# Patient Record
Sex: Male | Born: 1970
Health system: Southern US, Community
[De-identification: ages and names within clinical notes are randomized; demographics above are authoritative.]

## PROBLEM LIST (undated history)

## (undated) DIAGNOSIS — J302 Other seasonal allergic rhinitis: Secondary | ICD-10-CM

## (undated) DIAGNOSIS — J449 Chronic obstructive pulmonary disease, unspecified: Secondary | ICD-10-CM

## (undated) DIAGNOSIS — I10 Essential (primary) hypertension: Secondary | ICD-10-CM

## (undated) HISTORY — PX: HERNIA REPAIR: SHX51

---

## 2002-03-25 ENCOUNTER — Emergency Department (HOSPITAL_COMMUNITY): Admission: EM | Admit: 2002-03-25 | Discharge: 2002-03-25 | Payer: Self-pay | Admitting: Emergency Medicine

## 2002-03-25 ENCOUNTER — Encounter: Payer: Self-pay | Admitting: Emergency Medicine

## 2004-01-25 ENCOUNTER — Emergency Department (HOSPITAL_COMMUNITY): Admission: EM | Admit: 2004-01-25 | Discharge: 2004-01-26 | Payer: Self-pay | Admitting: Emergency Medicine

## 2008-12-11 ENCOUNTER — Emergency Department (HOSPITAL_COMMUNITY): Admission: EM | Admit: 2008-12-11 | Discharge: 2008-12-12 | Payer: Self-pay | Admitting: Emergency Medicine

## 2012-02-24 ENCOUNTER — Emergency Department (HOSPITAL_COMMUNITY)
Admission: EM | Admit: 2012-02-24 | Discharge: 2012-02-24 | Disposition: A | Discharge status: Home / Self Care | Payer: Self-pay | Attending: Emergency Medicine | Admitting: Emergency Medicine

## 2012-02-24 ENCOUNTER — Encounter (HOSPITAL_COMMUNITY): Payer: Self-pay | Admitting: *Deleted

## 2012-02-24 DIAGNOSIS — F17200 Nicotine dependence, unspecified, uncomplicated: Secondary | ICD-10-CM | POA: Insufficient documentation

## 2012-02-24 DIAGNOSIS — T394X5A Adverse effect of antirheumatics, not elsewhere classified, initial encounter: Secondary | ICD-10-CM | POA: Insufficient documentation

## 2012-02-24 DIAGNOSIS — T783XXA Angioneurotic edema, initial encounter: Secondary | ICD-10-CM | POA: Insufficient documentation

## 2012-02-24 LAB — POCT I-STAT, CHEM 8
BUN: 11 mg/dL (ref 6–23)
Calcium, Ion: 1.14 mmol/L (ref 1.12–1.23)
Chloride: 108 mEq/L (ref 96–112)
Creatinine, Ser: 0.8 mg/dL (ref 0.50–1.35)
Glucose, Bld: 97 mg/dL (ref 70–99)
HCT: 40 % (ref 39.0–52.0)
Hemoglobin: 13.6 g/dL (ref 13.0–17.0)
Potassium: 5 mEq/L (ref 3.5–5.1)
Sodium: 139 mEq/L (ref 135–145)
TCO2: 24 mmol/L (ref 0–100)

## 2012-02-24 MED ORDER — DIPHENHYDRAMINE HCL 50 MG/ML IJ SOLN
25.0000 mg | Freq: Once | INTRAMUSCULAR | Status: AC
Start: 2012-02-24 — End: 2012-02-24
  Administered 2012-02-24: 25 mg via INTRAVENOUS
  Filled 2012-02-24: qty 1

## 2012-02-24 MED ORDER — FAMOTIDINE 40 MG PO TABS: 40.0000 mg | ORAL_TABLET | Freq: Two times a day (BID) | ORAL | Status: DC

## 2012-02-24 MED ORDER — DIPHENHYDRAMINE HCL 25 MG PO TABS: 25.0000 mg | ORAL_TABLET | Freq: Four times a day (QID) | ORAL | Status: DC

## 2012-02-24 MED ORDER — FAMOTIDINE IN NACL 20-0.9 MG/50ML-% IV SOLN
20.0000 mg | Freq: Once | INTRAVENOUS | Status: AC
  Administered 2012-02-24: 20 mg via INTRAVENOUS
  Filled 2012-02-24: qty 50

## 2012-02-24 MED ORDER — SODIUM CHLORIDE 0.9 % IV SOLN
INTRAVENOUS | Status: DC
  Administered 2012-02-24: 125 mL/h via INTRAVENOUS

## 2012-02-24 MED ORDER — METHYLPREDNISOLONE SODIUM SUCC 125 MG IJ SOLR
125.0000 mg | Freq: Once | INTRAMUSCULAR | Status: AC
  Administered 2012-02-24: 125 mg via INTRAVENOUS
  Filled 2012-02-24: qty 2

## 2012-02-24 MED ORDER — PREDNISONE 20 MG PO TABS: 60.0000 mg | ORAL_TABLET | Freq: Every day | ORAL | Status: DC

## 2012-02-24 MED ORDER — EPINEPHRINE 0.3 MG/0.3ML IJ DEVI: 0.3000 mg | INTRAMUSCULAR | Status: DC | PRN

## 2012-02-24 NOTE — ED Notes (Signed)
Pt states he woke up this am with swelling to right face and lips which has progressively worsened. pts airway intact. Tongue not swollen. Pt denies taking medications or known allergy.

## 2012-02-24 NOTE — ED Provider Notes (Signed)
History     CSN: [id number] Arrival date & time 02/24/12  1450 First MD Initiated Contact with Patient 02/24/12 1616      Chief Complaint  Patient presents with  . Facial Swelling  . Oral Swelling   HPI Pt had taken ibuprofen on Saturday for back pain but he has taken that before without problems.  No other new medications.  No BP medications.   No new foods that he is aware of.  No known insect bites.  Pt noticed swelling in his lips this am.  He denies trouble with breathing or swallowing.  He did notice a little rash yesterday but it resolved.  He has never had this before. History reviewed. No pertinent past medical history.  History reviewed. No pertinent past surgical history.  History reviewed. No pertinent family history.  History  Substance Use Topics  . Smoking status: Current Every Day Smoker    Types: Cigarettes  . Smokeless tobacco: Not on file  . Alcohol Use: Yes      Review of Systems  All other systems reviewed and are negative.    Allergies  Dilaudid  Home Medications   Current Outpatient Rx  Name Route Sig Dispense Refill  . CETIRIZINE HCL 1 MG/ML PO SYRP Oral Take 10 mg by mouth daily.    . IBUPROFEN 200 MG PO TABS Oral Take 400 mg by mouth every 6 (six) hours as needed. Pain      BP 125/68  Pulse 73  Temp 99.1 F (37.3 C) (Oral)  Resp 18  Ht 5\' 10"  (1.778 m)  Wt 175 lb (79.379 kg)  BMI 25.11 kg/m2  SpO2 99%  Physical Exam  Nursing note and vitals reviewed. Constitutional: He appears well-developed and well-nourished. No distress.  HENT:  Head: Normocephalic and atraumatic. No trismus in the jaw.  Right Ear: External ear normal.  Left Ear: External ear normal.  Mouth/Throat: Mucous membranes are not dry and not cyanotic. Normal dentition. No dental caries. No oropharyngeal exudate or posterior oropharyngeal edema.       The patient has edema in his upper and lower lip with some perioral swelling  Eyes: Conjunctivae normal are  normal. Right eye exhibits no discharge. Left eye exhibits no discharge. No scleral icterus.  Neck: Neck supple. No tracheal deviation present.  Cardiovascular: Normal rate, regular rhythm and intact distal pulses.   Pulmonary/Chest: Effort normal and breath sounds normal. No stridor. No respiratory distress. He has no wheezes. He has no rales.  Abdominal: Soft. Bowel sounds are normal. He exhibits no distension. There is no tenderness. There is no rebound and no guarding.  Musculoskeletal: He exhibits no edema and no tenderness.  Neurological: He is alert. He has normal strength. No sensory deficit. Cranial nerve deficit:  no gross defecits noted. He exhibits normal muscle tone. He displays no seizure activity. Coordination normal.  Skin: Skin is warm and dry. No rash noted.  Psychiatric: He has a normal mood and affect.    ED Course  Procedures (including critical care time)   Labs Reviewed  POCT I-STAT, CHEM 8   No results found.   1. Angioedema       MDM  The patient was treated with Benadryl, Pepcid and Solu-Medrol.  He was monitored in the emergency department.  The patient did not have any difficulty with swallowing or breathing. We'll discharge him home with prescriptions for Benadryl Pepcid and prednisone. He also be given a prescription for an EpiPen to carry with him  as a precaution. I will give him referral to an allergist.  At this time etiology of his angioedema is unclear. He's not having any pain to suggest some type of localized reaction. He is not on any new medications        Celene Kras, MD 02/24/12 [phone]

## 2013-10-25 ENCOUNTER — Encounter (HOSPITAL_COMMUNITY): Payer: Self-pay | Admitting: Emergency Medicine

## 2013-10-25 ENCOUNTER — Emergency Department (HOSPITAL_COMMUNITY)
Admission: EM | Admit: 2013-10-25 | Discharge: 2013-10-25 | Disposition: A | Discharge status: Home / Self Care | Payer: Self-pay | Attending: Emergency Medicine | Admitting: Emergency Medicine

## 2013-10-25 DIAGNOSIS — Z791 Long term (current) use of non-steroidal anti-inflammatories (NSAID): Secondary | ICD-10-CM | POA: Insufficient documentation

## 2013-10-25 DIAGNOSIS — Z79899 Other long term (current) drug therapy: Secondary | ICD-10-CM | POA: Insufficient documentation

## 2013-10-25 DIAGNOSIS — IMO0002 Reserved for concepts with insufficient information to code with codable children: Secondary | ICD-10-CM | POA: Insufficient documentation

## 2013-10-25 DIAGNOSIS — K0889 Other specified disorders of teeth and supporting structures: Secondary | ICD-10-CM | POA: Diagnosis present

## 2013-10-25 DIAGNOSIS — K089 Disorder of teeth and supporting structures, unspecified: Secondary | ICD-10-CM | POA: Insufficient documentation

## 2013-10-25 DIAGNOSIS — K0381 Cracked tooth: Secondary | ICD-10-CM | POA: Insufficient documentation

## 2013-10-25 DIAGNOSIS — F17200 Nicotine dependence, unspecified, uncomplicated: Secondary | ICD-10-CM | POA: Insufficient documentation

## 2013-10-25 MED ORDER — PENICILLIN V POTASSIUM 500 MG PO TABS: 500.0000 mg | ORAL_TABLET | Freq: Four times a day (QID) | ORAL | Status: AC

## 2013-10-25 MED ORDER — OXYCODONE-ACETAMINOPHEN 5-325 MG PO TABS: 1.0000 | ORAL_TABLET | Freq: Four times a day (QID) | ORAL | Status: DC | PRN

## 2013-10-25 MED ORDER — OXYCODONE-ACETAMINOPHEN 5-325 MG PO TABS
1.0000 | ORAL_TABLET | Freq: Once | ORAL | Status: AC
  Administered 2013-10-25: 1 via ORAL
  Filled 2013-10-25: qty 1

## 2013-10-25 NOTE — ED Notes (Signed)
The pt is c/o a toothache for 2 days and his rt face is swollen

## 2013-10-25 NOTE — ED Provider Notes (Signed)
CSN: [id number]     Arrival date & time 10/25/13  0617 History   First MD Initiated Contact with Patient 10/25/13 [phone]     Chief Complaint  Patient presents with  . Dental Pain     (Consider location/radiation/quality/duration/timing/severity/associated sxs/prior Treatment) Patient is a 43 y.o. male presenting with tooth pain. The history is provided by the patient.  Dental Pain Location:  Upper Upper teeth location:  1/RU 3rd molar Quality:  Aching Severity:  Moderate Onset quality:  Gradual Duration:  2 days Timing:  Constant Progression:  Worsening Chronicity:  Recurrent Context comment:  Chronic fracture Relieved by:  Nothing Worsened by:  Nothing tried Ineffective treatments:  NSAIDs Associated symptoms: no drooling, no fever, no headaches and no neck pain     History reviewed. No pertinent past medical history. History reviewed. No pertinent past surgical history. No family history on file. History  Substance Use Topics  . Smoking status: Current Every Day Smoker    Types: Cigarettes  . Smokeless tobacco: Not on file  . Alcohol Use: Yes    Review of Systems  Constitutional: Negative for fever.  HENT: Negative for drooling and rhinorrhea.   Eyes: Negative for pain.  Respiratory: Negative for cough and shortness of breath.   Cardiovascular: Negative for chest pain and leg swelling.  Gastrointestinal: Negative for nausea, vomiting, abdominal pain and diarrhea.  Genitourinary: Negative for dysuria and hematuria.  Musculoskeletal: Negative for gait problem and neck pain.  Skin: Negative for color change.  Neurological: Negative for numbness and headaches.  Hematological: Negative for adenopathy.  Psychiatric/Behavioral: Negative for behavioral problems.  All other systems reviewed and are negative.     Allergies  Dilaudid  Home Medications   Prior to Admission medications   Medication Sig Start Date End Date Taking? Authorizing Provider  cetirizine  (ZYRTEC) 1 MG/ML syrup Take 10 mg by mouth daily.    Historical Provider, MD  diphenhydrAMINE (BENADRYL) 25 MG tablet Take 1 tablet (25 mg total) by mouth every 6 (six) hours. 02/24/12   Linwood DibblesJon Knapp, MD  EPINEPHrine (EPIPEN) 0.3 mg/0.3 mL DEVI Inject 0.3 mLs (0.3 mg total) into the muscle as needed (Use for life threatening allergic reaction if you begin to feel you're having difficulty with breathing or swallowing). 02/24/12   Linwood DibblesJon Knapp, MD  famotidine (PEPCID) 40 MG tablet Take 1 tablet (40 mg total) by mouth 2 (two) times daily. 02/24/12   Linwood DibblesJon Knapp, MD  ibuprofen (ADVIL,MOTRIN) 200 MG tablet Take 400 mg by mouth every 6 (six) hours as needed. Pain    Historical Provider, MD  predniSONE (DELTASONE) 20 MG tablet Take 3 tablets (60 mg total) by mouth daily. 02/24/12   Linwood DibblesJon Knapp, MD   BP 134/90  Pulse 98  Temp(Src) 99 F (37.2 C) (Oral)  Resp 20  Ht 5\' 11"  (1.803 m)  Wt 156 lb 8 oz (70.988 kg)  BMI 21.84 kg/m2  SpO2 97% Physical Exam  Nursing note and vitals reviewed. Constitutional: He is oriented to person, place, and time. He appears well-developed and well-nourished.  HENT:  Head: Normocephalic and atraumatic.  Right Ear: External ear normal.  Left Ear: External ear normal.  Nose: Nose normal.  Mouth/Throat: Oropharynx is clear and moist. No oropharyngeal exudate.  Chronic appearing Rennis Hardingllis type III fracture of right upper third molar.  No evidence of oral abscess.  No trismus noted.  Normal range of motion of the neck.  Mildly prominent bilateral anterior cervical adenopathy.  Mild swelling of the right  buccal mucosa.  Normal appearing tympanic membranes bilaterally.  Eyes: Conjunctivae and EOM are normal. Pupils are equal, round, and reactive to light.  Neck: Normal range of motion. Neck supple.  Cardiovascular: Normal rate, regular rhythm, normal heart sounds and intact distal pulses.  Exam reveals no gallop and no friction rub.   No murmur heard. Pulmonary/Chest: Effort  normal and breath sounds normal. No respiratory distress. He has no wheezes.  Abdominal: Soft. Bowel sounds are normal. He exhibits no distension. There is no tenderness. There is no rebound and no guarding.  Musculoskeletal: Normal range of motion. He exhibits no edema and no tenderness.  Neurological: He is alert and oriented to person, place, and time.  Skin: Skin is warm and dry.  Psychiatric: He has a normal mood and affect. His behavior is normal.    ED Course  Procedures (including critical care time) Labs Review Labs Reviewed - No data to display  Imaging Review No results found.   EKG Interpretation None      MDM   Final diagnoses:  Pain, dental    7:02 AM 43 y.o. male presents with dental pain. He states that he has had intermittent dental pain do to a fractured right upper third molar for the last 2 years. He notes the began again 2 days ago and has had some referred pain to his ear and head. He denies any fevers. He is afebrile and vital signs are unremarkable here. He has mild swelling to the right buccal mucosa but no evidence of periapical abscess. Will provide antibiotics and referral.  7:03 AM:  I have discussed the diagnosis/risks/treatment options with the patient and believe the pt to be eligible for discharge home to follow-up with dentist asap. We also discussed returning to the ED immediately if new or worsening sx occur. We discussed the sx which are most concerning (e.g., inc swelling, difficulty handling secretions, fever) that necessitate immediate return. Medications administered to the patient during their visit and any new prescriptions provided to the patient are listed below.  Medications given during this visit Medications  oxyCODONE-acetaminophen (PERCOCET/ROXICET) 5-325 MG per tablet 1 tablet (not administered)    New Prescriptions   OXYCODONE-ACETAMINOPHEN (PERCOCET) 5-325 MG PER TABLET    Take 1 tablet by mouth every 6 (six) hours as needed  for moderate pain.   PENICILLIN V POTASSIUM (VEETID) 500 MG TABLET    Take 1 tablet (500 mg total) by mouth 4 (four) times daily.       Junius ArgyleForrest S Harrison, MD 10/25/13 [phone]

## 2013-10-25 NOTE — Discharge Instructions (Signed)
Dental Pain °Toothache is pain in or around a tooth. It may get worse with chewing or with cold or heat.  °HOME CARE °· Your dentist may use a numbing medicine during treatment. If so, you may need to avoid eating until the medicine wears off. Ask your dentist about this. °· Only take medicine as told by your dentist or doctor. °· Avoid chewing food near the painful tooth until after all treatment is done. Ask your dentist about this. °GET HELP RIGHT AWAY IF:  °· The problem gets worse or new problems appear. °· You have a fever. °· There is redness and puffiness (swelling) of the face, jaw, or neck. °· You cannot open your mouth. °· There is pain in the jaw. °· There is very bad pain that is not helped by medicine. °MAKE SURE YOU:  °· Understand these instructions. °· Will watch your condition. °· Will get help right away if you are not doing well or get worse. °Document Released: 10/09/2007 Document Revised: 07/15/2011 Document Reviewed: 10/09/2007 °ExitCare® Patient Information ©2015 [employer]. This information is not intended to replace advice given to you by your health care provider. Make sure you discuss any questions you have with your health care provider. ° ° ° °Emergency Department Resource Guide °1) Find a Doctor and Pay Out of Pocket °Although you won't have to find out who is covered by your insurance plan, it is a good idea to ask around and get recommendations. You will then need to call the office and see if the doctor you have chosen will accept you as a new patient and what types of options they offer for patients who are self-pay. Some doctors offer discounts or will set up payment plans for their patients who do not have insurance, but you will need to ask so you aren't surprised when you get to your appointment. ° °2) Contact Your Local Health Department °Not all health departments have doctors that can see patients for sick visits, but many do, so it is worth a call to see if yours does.  If you don't know where your local health department is, you can check in your phone book. The CDC also has a tool to help you locate your state's health department, and many state websites also have listings of all of their local health departments. ° °3) Find a Walk-in Clinic °If your illness is not likely to be very severe or complicated, you may want to try a walk in clinic. These are popping up all over the country in pharmacies, drugstores, and shopping centers. They're usually staffed by nurse practitioners or physician assistants that have been trained to treat common illnesses and complaints. They're usually fairly quick and inexpensive. However, if you have serious medical issues or chronic medical problems, these are probably not your best option. ° °No Primary Care Doctor: °- Call [facility] at  832-8000 - they can help you locate a primary care doctor that  accepts your insurance, provides certain services, etc. °- Physician Referral Service- 1-800-533-3463 ° °Chronic Pain Problems: °Organization         Address  Phone   Notes  °[facility]  (336) 297-2271 Patients need to be referred by their primary care doctor.  ° °Medication Assistance: °Organization         Address  Phone   Notes  °[place] [facility] [address] °[place], [place] [address] (336) 641-8030 --Must be a resident   of [place] °-- Must have NO insurance coverage whatsoever (no Medicaid/ Medicare, etc.) °-- The pt. MUST have a primary care doctor that directs their care regularly and follows them in the community °  °[facility]  (866) 331-1348   °[employer]  (888) 892-1162   ° °Agencies that provide inexpensive medical care: °Organization         Address  Phone   Notes  °[facility]  (336) 832-8035   °[facility]    (336) 832-7272   °[facility] [address] °[place], [place] [address] (336) 832-4777   °[facility] [address], °[place] (336) 271-4999   °[facility]    (336) 373-0678   °[facility]    (336) 272-1050   °[facility] ° [address], [place] Phone:  (336) 832-4444, Fax:  (336) 832-4440 Hours of Operation:  9 am - 6 pm, M-F.  Also accepts Medicaid/Medicare and self-pay.  °[facility] ° [address], [place] Phone: (336) 832-3150, Fax: (336) 832-3151. Hours of Operation:  8:30 am - 5:30 pm, M-F.  Also accepts Medicaid and self-pay.  °[facility] [place] [address], [place] Phone: (336) 878-6027   °[facility] [address], [place], [place] (336)723-1848, Ext. 123 Mondays & Thursdays: 7-9 AM.  First 15 patients are seen on a first come, first serve basis. °  ° °Medicaid-accepting [place] Providers: ° °Organization         Address  Phone   Notes  °[facility] [address], [place] (336) 641-2100 Also accepts self-pay patients.  °[facility] [address], [place] ° (336) 856-9996   °[facility] [address], [place] (336) 288-8857   °[facility] [address] [place] [address], [place] (336) 299-7000   °Veita Bland [address], [place]  ° (336) 373-1557 Only accepts [place] Access Medicaid patients after they have their name applied to their card.  ° °Self-Pay (no insurance) in [place]: ° °Organization         Address  Phone   Notes  °Sickle Cell Patients, [facility] [address], [place] (336) 832-1970   °[facility] [address], [place] (336) 832-4400   °[facility] [place] ° [address], [place] (336) 992-4800   °[facility]/Dr. Osei-Bonsu ° [address] [place] [address], [place] or [address], [place] (336) 841-8500  Phone number for both [place] and [place] locations is the same.  °[facility] [address], [place] (336) 299-0000   °[facility] [address] [place] [address], [place] or [address] (336) 852-7530 °(336) 878-2260   °[facility] [address],  (336) 350-1642, phone; (336) 294-5005, fax Sees patients 1st and 3rd Saturday of every month.  Must not qualify for public or private insurance (i.e. Medicaid, Medicare, [place] Health Choice, Veterans' Benefits) • Household income should be no more than 200% of the poverty level •The clinic cannot treat you if you are pregnant or think you are pregnant • Sexually transmitted diseases are not treated at the clinic.  ° ° °Dental Care: °Organization         Address    Phone  Notes  °[facility] [address], [place] (336) 641-6152 Accepts children up to age 21 who are enrolled in Medicaid or [place] Health Choice; pregnant women with a Medicaid card; and children who have applied for Medicaid or [place] Health Choice, but were declined, whose parents can pay a reduced fee at time of service.  °[facility] [place]  [address], [place] (336) 641-7733 Accepts children up to age 21 who are enrolled in Medicaid or [place] Health Choice; pregnant women with a Medicaid card; and children who have applied for Medicaid or [place] Health Choice, but were declined, whose parents can pay a reduced fee at time of service.  °[facility] ° [address], [place] (336) 641-4533 Patients are seen by appointment only. Walk-ins are not accepted. [facility] will see patients 18 years of age and older. °Monday - Tuesday (8am-5pm) °Most Wednesdays (8:30-5pm) °$30 per visit, cash only  °[facility] ° [address], [place] (336) 641-4533 Patients are seen by appointment  only. Walk-ins are not accepted. [facility] will see patients 18 years of age and older. °One Wednesday Evening (Monthly: Volunteer Based).  $30 per visit, cash only  °[facility]  (919) 537-3737 for adults; Children under age 4, call Graduate Pediatric Dentistry at (919) [phone]. Children aged 4-14, please call (919) [phone] to request a pediatric application. ° Dental services are provided in all areas of dental care including fillings, crowns and bridges, complete and partial dentures, implants, gum treatment, root canals, and extractions. Preventive care is also provided. Treatment is provided to both adults and children. °Patients are selected via a lottery and there is often a waiting list. °  °[facility] [address], °[place] ° (336) 763-8833 [email or web address] °  °[facility] [address], [place], [place] (336)723-1848, Ext. 123 Second and Fourth Thursday of each month, opens at 6:30 AM; Clinic ends at 9 AM.  Patients are seen on a first-come first-served basis, and a limited number are seen during each clinic.  ° °[facility] ° [address], [place], [place] (336) 723-7904   Eligibility Requirements °You must have lived in [place], [place], or [place] counties for at least the last three months. °  You cannot be eligible for state or federal sponsored healthcare insurance, including Veterans Administration, Medicaid, or Medicare. °  You generally cannot be eligible for healthcare insurance through your employer.  °  How to apply: °Eligibility screenings are held every Tuesday and Wednesday afternoon from 1:00 pm until 4:00 pm. You do not need an appointment for the interview!  °[facility] [address], [place], [place] 336-631-2330   °[facility]  336-342-8273   °[facility]  336-703-3100   °[facility]  336-570-6415   ° °Behavioral Health  Resources in the Community: °Intensive Outpatient Programs °Organization         Address  Phone  Notes  °[facility] [address], [place], [place] 336-878-6098   °[facility] [address], [place], [place] 336-832-9800   °[facility] [address], [place], [place] ° 336-882-2125   °[facility] [address],  °[place], [place] 1-800-853-5163 or 336-641-4981   °Substance Abuse Resources °Organization           Address  Phone  Notes  °[facility]  336-882-2125   °[facility]  336-784-9470   °[facility]  336-285-9073   °[facility]  336-845-3988   °Residential & Outpatient Substance Abuse Program  1-800-659-3381   °Psychological Services °Organization         Address  Phone  Notes  °[facility]  336- 832-9600   °[facility]  336- 378-7881   °[facility] [address], [place] 1-800-853-5163 or 336-641-4981   ° °Mobile Crisis Teams °Organization         Address  Phone  Notes  °[facility], Mobile Crisis Care Unit  1-877-626-1772   °Assertive °[facility] ° [address] [place], [place] 336-834-9664   °Sharon DeEsch [address] °[place] 336-554-5454   ° °Self-Help/Support Groups °Organization         Address  Phone             Notes  °[facility] [place] - variety of support groups  336- 373-1402 Call for more information  °Narcotics Anonymous (NA), [facility] [address], °[place]  2 meetings at this location  ° °Residential Treatment Programs °Organization         Address  Phone  Notes  °[facility] [address],    °[place]  1-866-801-8205   °[facility] ° [address], [place], [place] 704-293-8524   °[facility] [address], [place] 336-845-3988 Admissions: 8am-3pm M-F  °[facility] [address]    °[place], [place] 336-841-1104   °[facility] [address], [place], [place] 336-379-7146   °[facility] [address]  °[place], [place] 336-285-9073   °[facility] Intensive Outpatient [address], [place], [place] 336-852-3033   °[facility] [address]  °[place], [place] 1-877-615-2722 or 336-784-9470   °[facility] [address] [place], [place] 336-227-7417 Accepts Medicaid  °[facility] [address]  °[place] 1-800-659-3381 Substance Abuse/Addiction Treatment  ° °[place] Behavioral Health Resources °Organization         Address  Phone  Notes  °[facility]  (888) 581-9988   °Julie Brannon, PhD [address] [place], [place]   (336) 349-5553 or (336) 951-0000   °[facility]   [address] °[place], [place] (336) 349-4454   °[facility] [address], [place], [place] (336) 342-8316 Insurance/Medicaid/sponsorship through [facility]  °[facility] [address]                                    [place], [place] (336) 342-8316 Therapy/tele-psych/case  °[facility] [address]  ° [place], [place] (336) 349-2233    °Dr. Arfeen  (336) 349-4544   °[facility] [place]  [employer] [place] [facility] 1) [address], [place] °2) [address], [place] °3)  [address], [place] (336) 349-3220 °(336) 342-7768 ° °(336) 342-8140   °[place] [facility] (336) 342-1394 or (336) 342-3537 (After Hours)    ° °  °

## 2013-10-25 NOTE — ED Notes (Signed)
Harrison, MD at bedside. 

## 2015-02-28 ENCOUNTER — Emergency Department (HOSPITAL_COMMUNITY): Payer: Self-pay

## 2015-02-28 ENCOUNTER — Emergency Department (HOSPITAL_COMMUNITY)
Admission: EM | Admit: 2015-02-28 | Discharge: 2015-02-28 | Disposition: A | Discharge status: Home / Self Care | Payer: Self-pay | Attending: Emergency Medicine | Admitting: Emergency Medicine

## 2015-02-28 ENCOUNTER — Encounter (HOSPITAL_COMMUNITY): Payer: Self-pay | Admitting: Emergency Medicine

## 2015-02-28 DIAGNOSIS — Z720 Tobacco use: Secondary | ICD-10-CM | POA: Insufficient documentation

## 2015-02-28 DIAGNOSIS — R911 Solitary pulmonary nodule: Secondary | ICD-10-CM | POA: Insufficient documentation

## 2015-02-28 DIAGNOSIS — J209 Acute bronchitis, unspecified: Secondary | ICD-10-CM | POA: Insufficient documentation

## 2015-02-28 DIAGNOSIS — R079 Chest pain, unspecified: Secondary | ICD-10-CM | POA: Insufficient documentation

## 2015-02-28 LAB — BASIC METABOLIC PANEL
Anion gap: 10 (ref 5–15)
BUN: 7 mg/dL (ref 6–20)
CHLORIDE: 104 mmol/L (ref 101–111)
CO2: 23 mmol/L (ref 22–32)
Calcium: 9.5 mg/dL (ref 8.9–10.3)
Creatinine, Ser: 0.81 mg/dL (ref 0.61–1.24)
GFR calc Af Amer: >60 mL/min (ref >60)
GFR calc non Af Amer: >60 mL/min (ref >60)
Glucose, Bld: 103 mg/dL — ABNORMAL HIGH (ref 65–99)
POTASSIUM: 4.6 mmol/L (ref 3.5–5.1)
SODIUM: 137 mmol/L (ref 135–145)

## 2015-02-28 LAB — CBC
HEMATOCRIT: 46.9 % (ref 39.0–52.0)
HEMOGLOBIN: 16.4 g/dL (ref 13.0–17.0)
MCH: 30.4 pg (ref 26.0–34.0)
MCHC: 35 g/dL (ref 30.0–36.0)
MCV: 86.9 fL (ref 78.0–100.0)
Platelets: 297 10*3/uL (ref 150–400)
RBC: 5.4 MIL/uL (ref 4.22–5.81)
RDW: 12.7 % (ref 11.5–15.5)
WBC: 12.3 10*3/uL — AB (ref 4.0–10.5)

## 2015-02-28 LAB — I-STAT TROPONIN, ED: Troponin i, poc: 0 ng/mL (ref 0.00–0.08)

## 2015-02-28 LAB — D-DIMER, QUANTITATIVE (NOT AT ARMC): D DIMER QUANT: 0.3 ug{FEU}/mL (ref 0.00–0.48)

## 2015-02-28 MED ORDER — AZITHROMYCIN 250 MG PO TABS: 250.0000 mg | ORAL_TABLET | Freq: Every day | ORAL | Status: DC

## 2015-02-28 MED ORDER — PREDNISONE 20 MG PO TABS
40.0000 mg | ORAL_TABLET | Freq: Once | ORAL | Status: AC
Start: 2015-02-28 — End: 2015-02-28
  Administered 2015-02-28: 40 mg via ORAL
  Filled 2015-02-28: qty 2

## 2015-02-28 MED ORDER — IPRATROPIUM-ALBUTEROL 0.5-2.5 (3) MG/3ML IN SOLN
3.0000 mL | Freq: Once | RESPIRATORY_TRACT | Status: AC
  Administered 2015-02-28: 3 mL via RESPIRATORY_TRACT
  Filled 2015-02-28: qty 3

## 2015-02-28 MED ORDER — PREDNISONE 10 MG PO TABS: 40.0000 mg | ORAL_TABLET | Freq: Every day | ORAL | Status: DC

## 2015-02-28 MED ORDER — ALBUTEROL SULFATE HFA 108 (90 BASE) MCG/ACT IN AERS
2.0000 | INHALATION_SPRAY | Freq: Once | RESPIRATORY_TRACT | Status: AC
  Administered 2015-02-28: 2 via RESPIRATORY_TRACT
  Filled 2015-02-28: qty 6.7

## 2015-02-28 NOTE — ED Notes (Signed)
Pt stable, ambulatory, states understanding of discharge instructions 

## 2015-02-28 NOTE — Discharge Instructions (Signed)
Acute Bronchitis Bronchitis is inflammation of the airways that extend from the windpipe into the lungs (bronchi). The inflammation often causes mucus to develop. This leads to a cough, which is the most common symptom of bronchitis.  In acute bronchitis, the condition usually develops suddenly and goes away over time, usually in a couple weeks. Smoking, allergies, and asthma can make bronchitis worse. Repeated episodes of bronchitis may cause further lung problems.  CAUSES Acute bronchitis is most often caused by the same virus that causes a cold. The virus can spread from person to person (contagious) through coughing, sneezing, and touching contaminated objects. SIGNS AND SYMPTOMS   Cough.   Fever.   Coughing up mucus.   Body aches.   Chest congestion.   Chills.   Shortness of breath.   Sore throat.  DIAGNOSIS  Acute bronchitis is usually diagnosed through a physical exam. Your health care provider will also ask you questions about your medical history. Tests, such as chest X-rays, are sometimes done to rule out other conditions.  TREATMENT  Acute bronchitis usually goes away in a couple weeks. Oftentimes, no medical treatment is necessary. Medicines are sometimes given for relief of fever or cough. Antibiotic medicines are usually not needed but may be prescribed in certain situations. In some cases, an inhaler may be recommended to help reduce shortness of breath and control the cough. A cool mist vaporizer may also be used to help thin bronchial secretions and make it easier to clear the chest.  HOME CARE INSTRUCTIONS  Get plenty of rest.   Drink enough fluids to keep your urine clear or pale yellow (unless you have a medical condition that requires fluid restriction). Increasing fluids may help thin your respiratory secretions (sputum) and reduce chest congestion, and it will prevent dehydration.   Take medicines only as directed by your health care provider.  If  you were prescribed an antibiotic medicine, finish it all even if you start to feel better.  Avoid smoking and secondhand smoke. Exposure to cigarette smoke or irritating chemicals will make bronchitis worse. If you are a smoker, consider using nicotine gum or skin patches to help control withdrawal symptoms. Quitting smoking will help your lungs heal faster.   Reduce the chances of another bout of acute bronchitis by washing your hands frequently, avoiding people with cold symptoms, and trying not to touch your hands to your mouth, nose, or eyes.   Keep all follow-up visits as directed by your health care provider.  SEEK MEDICAL CARE IF: Your symptoms do not improve after 1 week of treatment.  SEEK IMMEDIATE MEDICAL CARE IF:  You develop an increased fever or chills.   You have chest pain.   You have severe shortness of breath.  You have bloody sputum.   You develop dehydration.  You faint or repeatedly feel like you are going to pass out.  You develop repeated vomiting.  You develop a severe headache. MAKE SURE YOU:   Understand these instructions.  Will watch your condition.  Will get help right away if you are not doing well or get worse.   This information is not intended to replace advice given to you by your health care provider. Make sure you discuss any questions you have with your health care provider.   Document Released: 05/30/2004 Document Revised: 05/13/2014 Document Reviewed: 10/13/2012 Elsevier Interactive Patient Education 2016 [employer]  Pulmonary Nodule  A pulmonary nodule is a small, round spot in your lung. It is usually found  when pictures of your lungs are taken for other reasons. Most pulmonary nodules are not cancerous and do not cause symptoms. Tests will be done to make sure the nodule is not cancerous. Pulmonary nodules that are not cancerous usually do not require treatment. HOME CARE   Only take medicine as told by your  doctor.  Follow up with your doctor as told. GET HELP IF:  You have trouble breathing when doing activities.  You feel sick or more tired than normal.  You do not feel like eating.  You lose weight without trying to.  You have chills.  You have night sweats. GET HELP RIGHT AWAY IF:  You cannot catch your breath.  You start making whistling sounds when breathing (wheezing).  You have a cough that does not go away.  You cough up blood.  You are dizzy or feel like you are going to pass out.  You have sudden chest pain.  You have a fever or lasting symptoms for more than 2-3 days.  You have a fever and your symptoms suddenly get worse. MAKE SURE YOU:  Understand these instructions.  Will watch your condition.  Will get help right away if you are not doing well or get worse.   This information is not intended to replace advice given to you by your health care provider. Make sure you discuss any questions you have with your health care provider.   Document Released: 05/25/2010 Document Revised: 09/06/2014 Document Reviewed: 10/12/2012 Elsevier Interactive Patient Education [employer]

## 2015-02-28 NOTE — ED Notes (Signed)
Pt sts CP and SOB x 1 month that is worse with cough

## 2015-02-28 NOTE — ED Provider Notes (Signed)
CSN: [id number]     Arrival date & time 02/28/15  1032 History   First MD Initiated Contact with Patient 02/28/15 1041     Chief Complaint  Patient presents with  . Chest Pain  . Shortness of Breath     Patient is a 44 y.o. male presenting with chest pain and shortness of breath. The history is provided by the patient. No language interpreter was used.  Chest Pain Associated symptoms: shortness of breath   Shortness of Breath Associated symptoms: chest pain    Jesse Walters presents for evaluation of chest pain and shortness of breath. He has a 1 month history of shortness of breath with nonproductive cough. He has left sided chest pain that is worse with coughing. He describes this chest pain as a congestion-type feeling. He denies any fevers, vomiting, diarrhea, leg swelling. He is a smoker but over the last 3 weeks he is decreased, she smokes because it makes his shortness of breath worse. He has no medical problems. His mother has a history of lung cancer. Family history of cardiac disease.  History reviewed. No pertinent past medical history. History reviewed. No pertinent past surgical history. History reviewed. No pertinent family history. Social History  Substance Use Topics  . Smoking status: Current Every Day Smoker    Types: Cigarettes  . Smokeless tobacco: None  . Alcohol Use: Yes    Review of Systems  Respiratory: Positive for shortness of breath.   Cardiovascular: Positive for chest pain.  All other systems reviewed and are negative.     Allergies  Dilaudid  Home Medications   Prior to Admission medications   Medication Sig Start Date End Date Taking? Authorizing Provider  dextromethorphan (DELSYM) 30 MG/5ML liquid Take 60 mg by mouth as needed for cough.   Yes Historical Provider, MD   BP 159/67 mmHg  Pulse 85  Temp(Src) 98.6 F (37 C) (Oral)  Resp 24  SpO2 95% Physical Exam  Constitutional: He is oriented to person, place, and time. He appears  well-developed and well-nourished.  HENT:  Head: Normocephalic and atraumatic.  Cardiovascular: Normal rate and regular rhythm.   No murmur heard. Pulmonary/Chest: Effort normal. No respiratory distress. He exhibits no tenderness.  Diffuse end expiratory wheezes  Abdominal: Soft. There is no tenderness. There is no rebound and no guarding.  Musculoskeletal: He exhibits no edema or tenderness.  Neurological: He is alert and oriented to person, place, and time.  Skin: Skin is warm and dry.  Psychiatric: He has a normal mood and affect. His behavior is normal.  Nursing note and vitals reviewed.   ED Course  Procedures (including critical care time) Labs Review Labs Reviewed  BASIC METABOLIC PANEL - Abnormal; Notable for the following:    Glucose, Bld 103 (*)    All other components within normal limits  CBC - Abnormal; Notable for the following:    WBC 12.3 (*)    All other components within normal limits  D-DIMER, QUANTITATIVE (NOT AT [facility])  Rosezena Sensor, ED    Imaging Review Dg Chest 2 View  02/28/2015  CLINICAL DATA:  Patient with left-sided chest pain, cough shortness breath for 1 week. EXAM: CHEST  2 VIEW COMPARISON:  None. FINDINGS: Normal cardiac and mediastinal contours. No consolidative pulmonary opacities. No pleural effusion or pneumothorax. There is an 8 mm nodule within the right mid lung. Biapical bullous change. Regional skeleton is unremarkable. IMPRESSION: 8 mm nodule within the right mid lung. Recommend correlation with dedicated chest  CT in the nonacute setting. No acute cardiopulmonary process. These results will be called to the ordering clinician or representative by the Radiologist Assistant, and communication documented in the PACS or zVision Dashboard. Electronically Signed   By: Annia Beltrew  Soffer M.D.   On: 02/28/2015 11:42   I have personally reviewed and evaluated these images and lab results as part of my medical decision-making.   EKG  Interpretation None      MDM   Final diagnoses:  Acute bronchitis, unspecified organism  Pulmonary nodule    Patient here for 1 month of progressive chest pain and shortness of breath. Pain is associated with coughing. Pain is all located in the left lateral chest. Patient with diffuse wheezing on initial examination, on repeat evaluation following albuterol nebulizer treatment patient is feeling better and his wheezing has improved. Chest x-ray with 8 mm pulmonary nodule. Discussed with patient findings x-ray and need for further evaluation to rule out malignancy. Clinical picture is not consistent with ACS, dissection, CHF, PE.  Tilden FossaElizabeth Tailey Top, MD 03/01/15 [phone]

## 2015-03-16 ENCOUNTER — Ambulatory Visit: Payer: Self-pay | Admitting: Family Medicine

## 2016-09-12 IMAGING — DX DG CHEST 2V
2 series · 2 of 2 positions shown · non-contrast
Comparison: None.

CLINICAL DATA: Patient with left-sided chest pain, cough shortness
breath for 1 week.

EXAM:
CHEST  2 VIEW

[w chest pa]
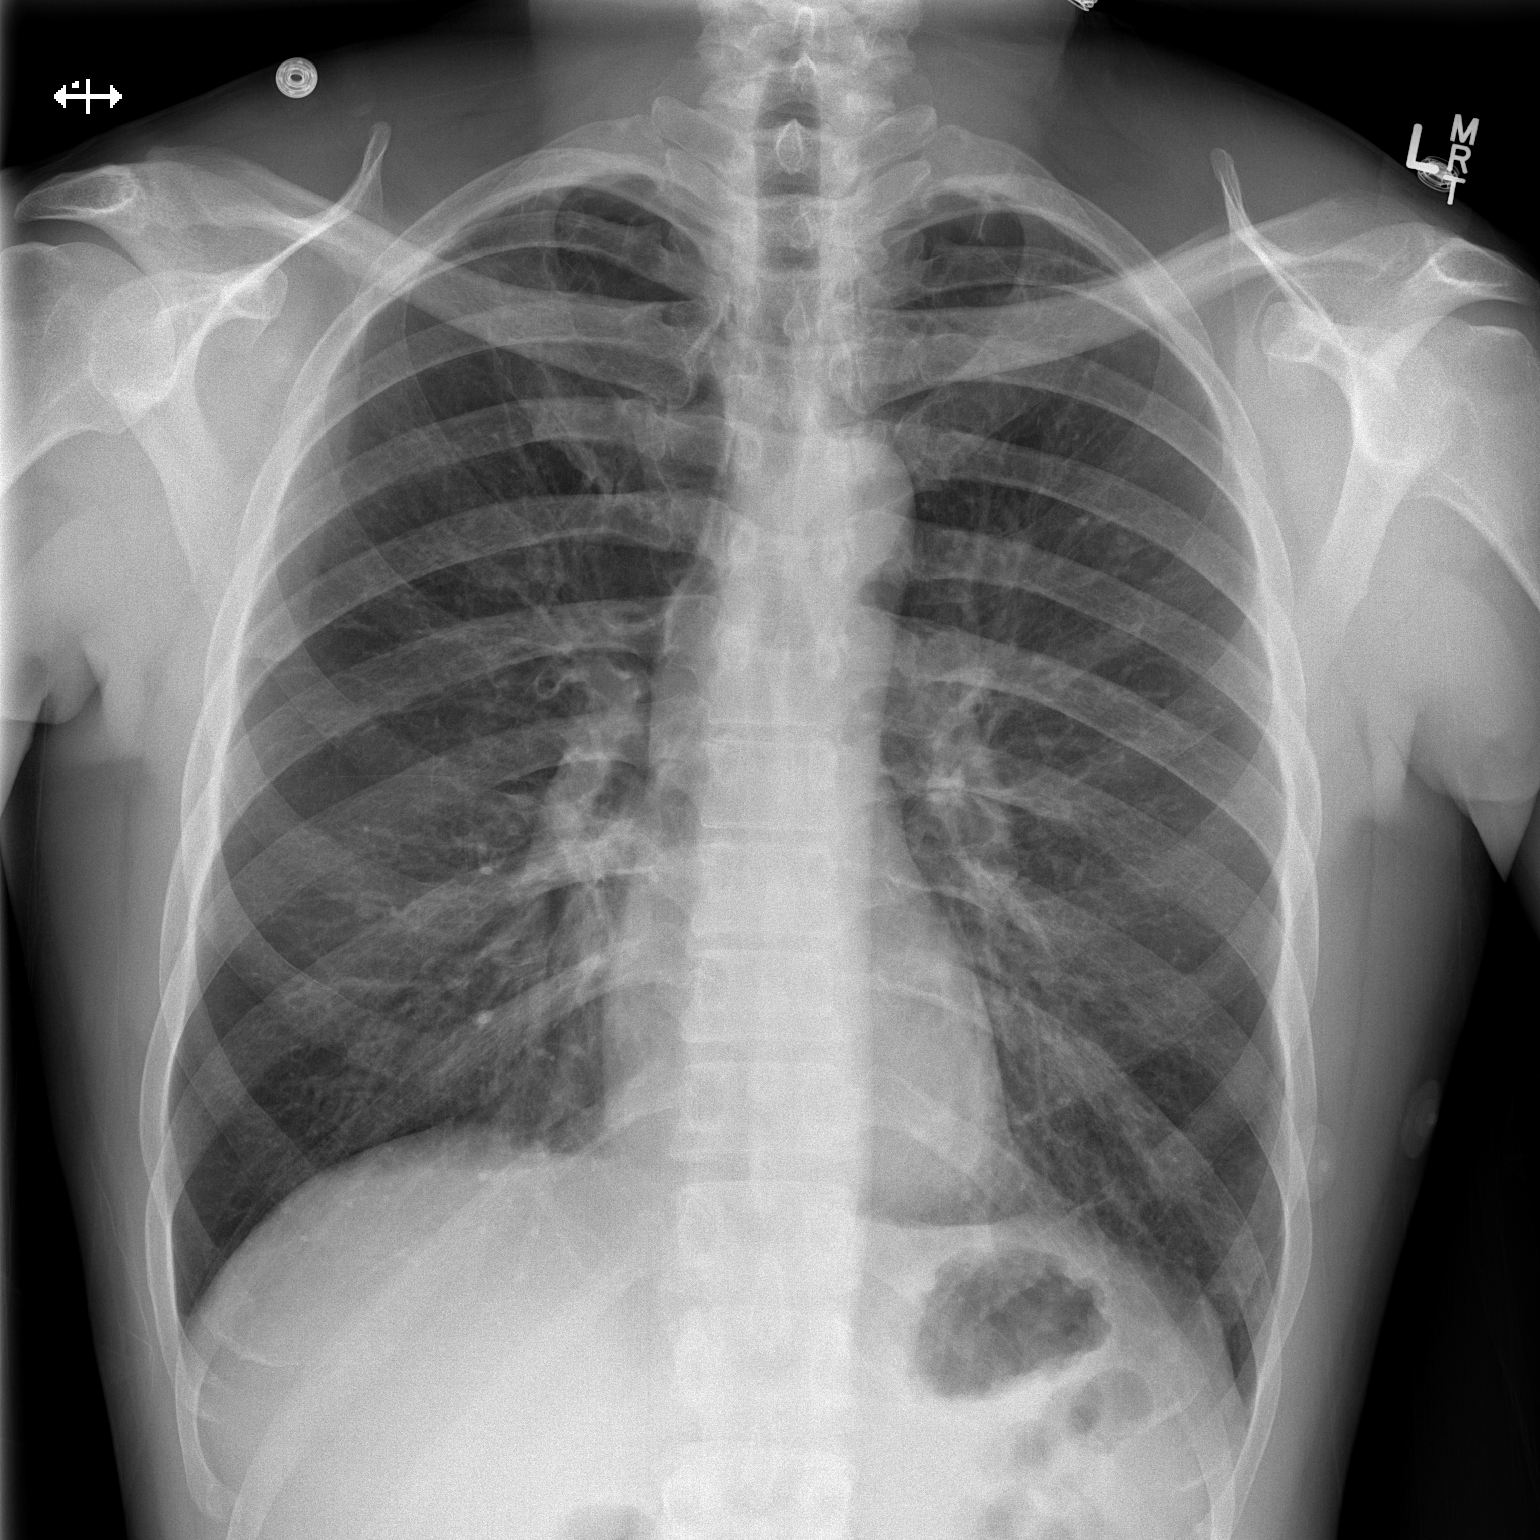

[w chest lat]
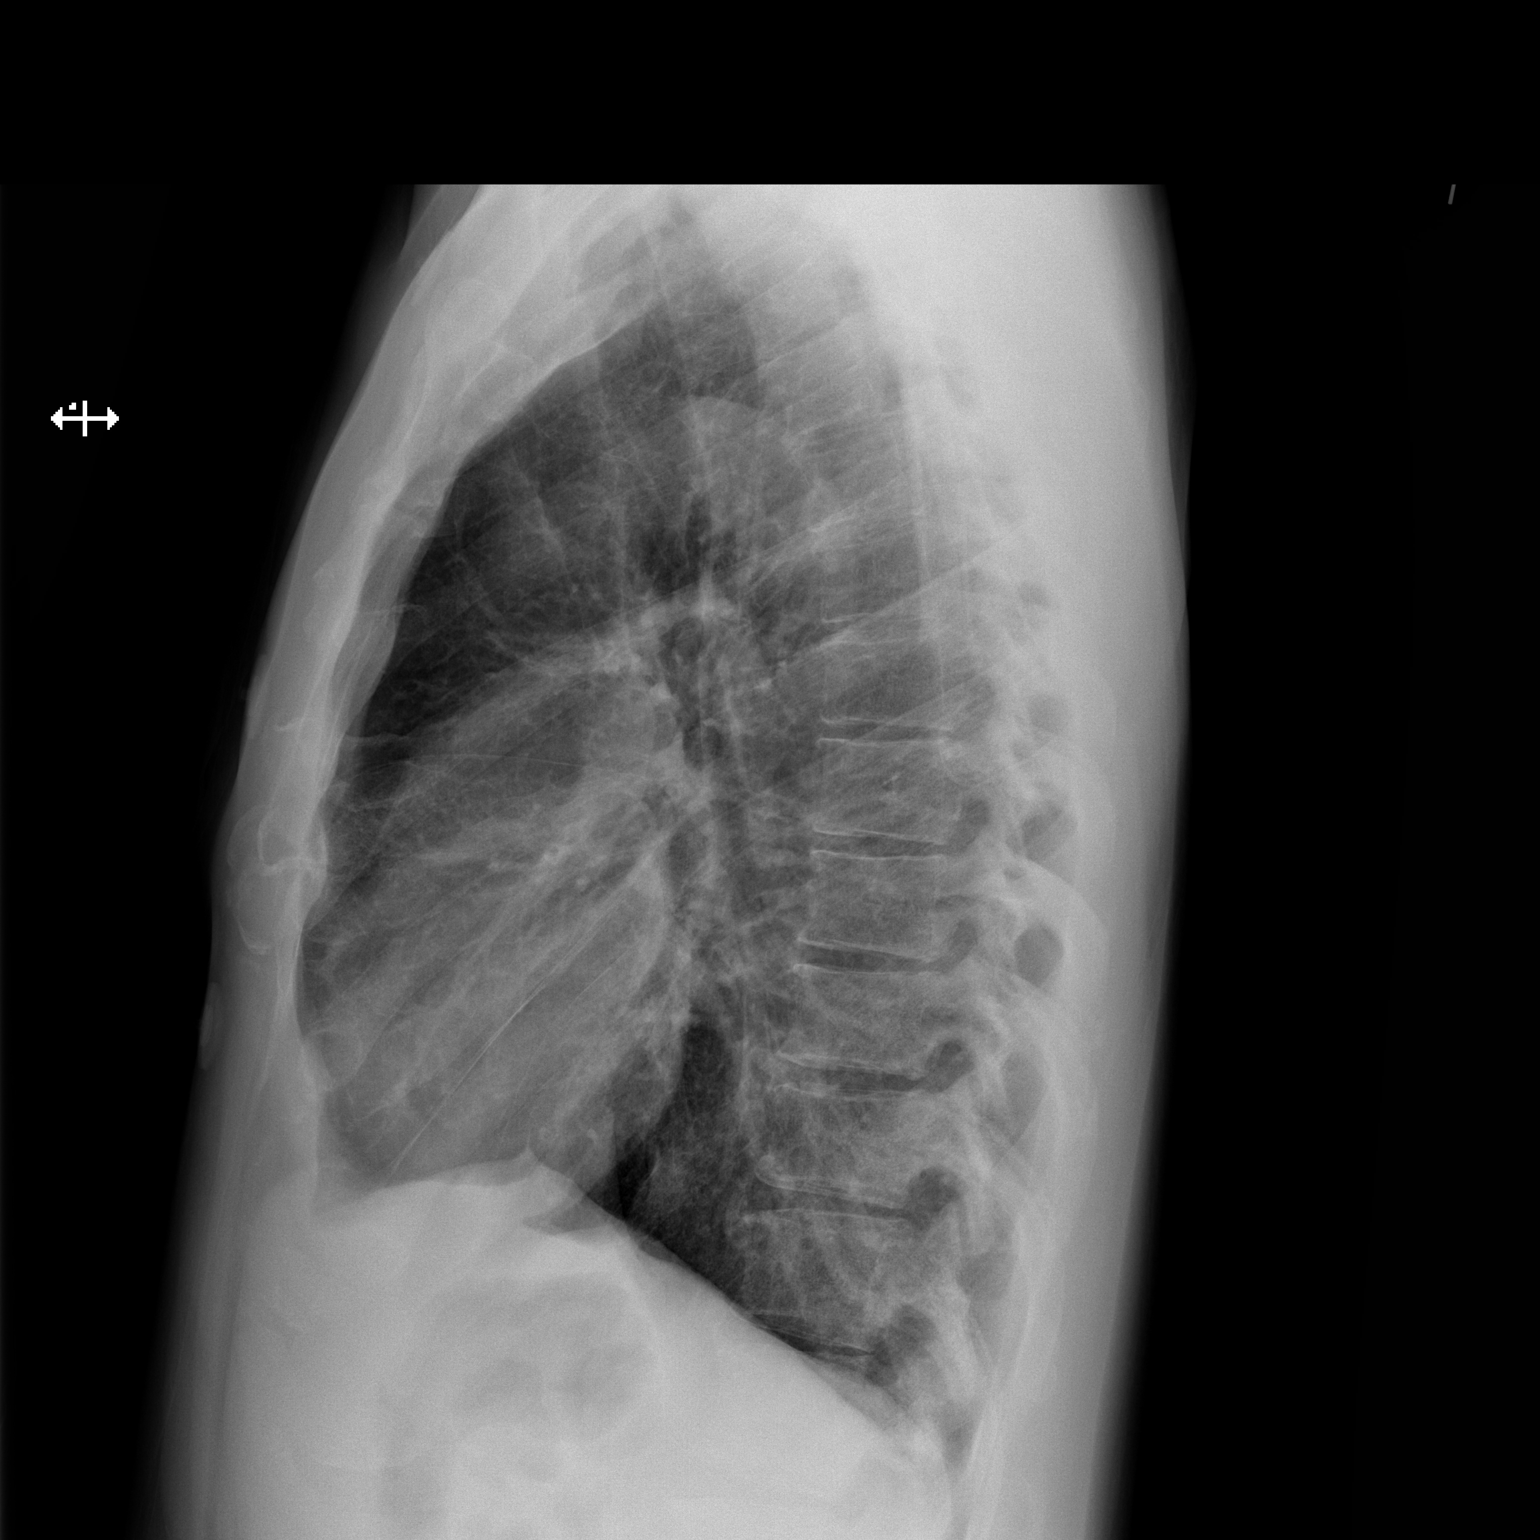

[2 of 2 positions shown; findings below may reference images not displayed]

FINDINGS: Normal cardiac and mediastinal contours. No consolidative pulmonary
opacities. No pleural effusion or pneumothorax. There is an 8 mm
nodule within the right mid lung. Biapical bullous change. Regional
skeleton is unremarkable.
IMPRESSION: 8 mm nodule within the right mid lung. Recommend correlation with
dedicated chest CT in the nonacute setting.

No acute cardiopulmonary process.

These results will be called to the ordering clinician or
representative by the Radiologist Assistant, and communication
documented in the PACS or zVision Dashboard.

## 2019-05-27 ENCOUNTER — Ambulatory Visit (INDEPENDENT_AMBULATORY_CARE_PROVIDER_SITE_OTHER): Payer: Self-pay

## 2019-05-27 ENCOUNTER — Encounter (HOSPITAL_COMMUNITY): Payer: Self-pay

## 2019-05-27 ENCOUNTER — Ambulatory Visit (HOSPITAL_COMMUNITY)
Admission: EM | Admit: 2019-05-27 | Discharge: 2019-05-27 | Disposition: A | Discharge status: Home / Self Care | Payer: Self-pay | Attending: Urgent Care | Admitting: Urgent Care

## 2019-05-27 ENCOUNTER — Other Ambulatory Visit: Payer: Self-pay

## 2019-05-27 DIAGNOSIS — R0602 Shortness of breath: Secondary | ICD-10-CM | POA: Insufficient documentation

## 2019-05-27 DIAGNOSIS — R05 Cough: Secondary | ICD-10-CM | POA: Insufficient documentation

## 2019-05-27 DIAGNOSIS — R0789 Other chest pain: Secondary | ICD-10-CM | POA: Insufficient documentation

## 2019-05-27 DIAGNOSIS — R030 Elevated blood-pressure reading, without diagnosis of hypertension: Secondary | ICD-10-CM

## 2019-05-27 DIAGNOSIS — R000 Tachycardia, unspecified: Secondary | ICD-10-CM

## 2019-05-27 DIAGNOSIS — I10 Essential (primary) hypertension: Secondary | ICD-10-CM | POA: Insufficient documentation

## 2019-05-27 DIAGNOSIS — M549 Dorsalgia, unspecified: Secondary | ICD-10-CM | POA: Insufficient documentation

## 2019-05-27 DIAGNOSIS — F17210 Nicotine dependence, cigarettes, uncomplicated: Secondary | ICD-10-CM | POA: Insufficient documentation

## 2019-05-27 DIAGNOSIS — R059 Cough, unspecified: Secondary | ICD-10-CM

## 2019-05-27 DIAGNOSIS — Z20822 Contact with and (suspected) exposure to covid-19: Secondary | ICD-10-CM | POA: Insufficient documentation

## 2019-05-27 HISTORY — DX: Other seasonal allergic rhinitis: J30.2

## 2019-05-27 MED ORDER — BENZONATATE 100 MG PO CAPS: 100.0000 mg | ORAL_CAPSULE | Freq: Three times a day (TID) | ORAL | 0 refills | Status: DC | PRN

## 2019-05-27 MED ORDER — ALBUTEROL SULFATE HFA 108 (90 BASE) MCG/ACT IN AERS
INHALATION_SPRAY | RESPIRATORY_TRACT | Status: AC
  Filled 2019-05-27: qty 6.7

## 2019-05-27 MED ORDER — ALBUTEROL SULFATE HFA 108 (90 BASE) MCG/ACT IN AERS
2.0000 | INHALATION_SPRAY | Freq: Once | RESPIRATORY_TRACT | Status: AC
  Administered 2019-05-27: 16:00:00 2 via RESPIRATORY_TRACT

## 2019-05-27 MED ORDER — PREDNISONE 20 MG PO TABS: ORAL_TABLET | ORAL | 0 refills | Status: DC

## 2019-05-27 MED ORDER — PROMETHAZINE-DM 6.25-15 MG/5ML PO SYRP: 5.0000 mL | ORAL_SOLUTION | Freq: Every evening | ORAL | 0 refills | Status: DC | PRN

## 2019-05-27 MED ORDER — AMLODIPINE BESYLATE 5 MG PO TABS: 5.0000 mg | ORAL_TABLET | Freq: Every day | ORAL | 0 refills | Status: DC

## 2019-05-27 NOTE — Discharge Instructions (Signed)
We will notify you of your COVID-19 test results as they arrive and may take between 2 to 7 days.  In the meantime, if you develop worsening symptoms including fever, chest pain, shortness of breath despite our current treatment plan then please report to the emergency room as this may be a sign of worsening status from possible COVID-19 infection.  We will manage this as a viral syndrome. For sore throat or cough try using a honey-based tea. Use 3 teaspoons of honey with juice squeezed from half lemon. Place shaved pieces of ginger into 1/2-1 cup of water and warm over stove top. Then mix the ingredients and repeat every 4 hours as needed. Please take Tylenol 500mg  every 6 hours. Hydrate very well with at least 2 liters of water. Eat light meals such as soups to replenish electrolytes and soft fruits, veggies. Start an antihistamine like Zyrtec, Allegra or Claritin for postnasal drainage, sinus congestion.

## 2019-05-27 NOTE — ED Triage Notes (Signed)
Pt c/o SOB, cough x2 weeks. States cough and SOB have progressively gotten worse, now with left rib/back pain that increases with cough. Denies fever, chills. SOB observed with speaking and exertion. SaO2 95% with talking.  Pt states his SOB coincided after smoking marijuana product approx 1 month ago.   Denies dizziness, diaphoresis or n/v or sternal CP.

## 2019-05-27 NOTE — ED Provider Notes (Signed)
[facility]   MRN: [id number] DOB: Feb 23, 1971  Subjective:   Jesse Walters is a 49 y.o. male presenting for 2-3 week history of intermittent mid-left sided chest pain, worse at bed time and with strenuous activities. Smokes 1ppd.  Admits that he has previously used to have a family member's albuterol inhaler and this helped.  States that he has had trouble with bronchitis in the past.  He also does smoke marijuana.  Patient is not interested in regular healthcare.  He has been told he has high blood pressure in the past but does not want to pay for medication for this.  Denies taking chronic medications.    Allergies  Allergen Reactions  . Dilaudid [Hydromorphone Hcl] Hives    Past Medical History:  Diagnosis Date  . Seasonal allergic rhinitis      Past Surgical History:  Procedure Laterality Date  . HERNIA REPAIR      Family History  Problem Relation Age of Onset  . Cancer Mother     Social History   Tobacco Use  . Smoking status: Current Every Day Smoker    Types: Cigarettes  Substance Use Topics  . Alcohol use: Yes  . Drug use: Yes    Review of Systems  Constitutional: Positive for malaise/fatigue. Negative for fever.  HENT: Negative for congestion, ear pain, sinus pain and sore throat.   Eyes: Negative for discharge and redness.  Respiratory: Positive for cough and shortness of breath. Negative for hemoptysis and wheezing.   Cardiovascular: Positive for chest pain.  Gastrointestinal: Negative for abdominal pain, diarrhea, nausea and vomiting.  Genitourinary: Negative for dysuria, flank pain and hematuria.  Musculoskeletal: Negative for myalgias.  Skin: Negative for rash.  Neurological: Negative for dizziness, weakness and headaches.  Psychiatric/Behavioral: Negative for depression and substance abuse.     Objective:   Vitals: BP (!) 181/90 (BP Location: Left Arm)   Pulse (!) 102   Temp 98.9 F (37.2 C) (Oral)   Resp (!) 28   SpO2 96%   BP  Readings from Last 3 Encounters:  05/27/19 (!) 181/90  02/28/15 169/97  10/25/13 134/90     BP 153/93 on recheck by PA-Lazariah Savard.   Physical Exam Constitutional:      General: He is not in acute distress.    Appearance: Normal appearance. He is well-developed. He is not ill-appearing, toxic-appearing or diaphoretic.  HENT:     Head: Normocephalic and atraumatic.     Right Ear: External ear normal.     Left Ear: External ear normal.     Nose: Nose normal.     Mouth/Throat:     Mouth: Mucous membranes are moist.     Pharynx: Oropharynx is clear.  Eyes:     General: No scleral icterus.    Extraocular Movements: Extraocular movements intact.     Pupils: Pupils are equal, round, and reactive to light.  Cardiovascular:     Rate and Rhythm: Regular rhythm. Tachycardia present.     Heart sounds: Normal heart sounds. No murmur. No friction rub. No gallop.   Pulmonary:     Effort: Accessory muscle usage present. No respiratory distress.     Breath sounds: No stridor. Decreased breath sounds present. No wheezing, rhonchi or rales.     Comments: Has to pause to catch breath in between sentences. Neurological:     Mental Status: He is alert and oriented to person, place, and time.  Psychiatric:        Mood and Affect:  Mood normal.        Behavior: Behavior normal.        Thought Content: Thought content normal.     DG Chest 2 View  Result Date: 05/27/2019 CLINICAL DATA:  Cough, chest pain EXAM: CHEST - 2 VIEW COMPARISON:  2016 FINDINGS: The heart size and mediastinal contours are within normal limits. No new consolidation or edema. Stable subcentimeter nodular density overlying the lower right lung is therefore benign. The visualized skeletal structures are unremarkable. IMPRESSION: No acute process in the chest. Electronically Signed   By: Macy Mis M.D.   On: 05/27/2019 16:02    ED ECG REPORT   Date: 05/27/2019  Rate: 87bpm  Rhythm: normal sinus rhythm  QRS Axis: normal   Intervals: normal  ST/T Wave abnormalities: normal  Conduction Disutrbances:none  Narrative Interpretation: Sinus rhythm at 87 bpm.  Largely unchanged from previous EKG on 03/02/2015.  Old EKG Reviewed: unchanged  I have personally reviewed the EKG tracing and agree with the computerized printout as noted.   Assessment and Plan :   1. Cough   2. Atypical chest pain   3. Shortness of breath   4. Essential hypertension   5. Elevated blood pressure reading   6. Cough with exposure to COVID-19 virus     Suspect patient has developed COPD given his extensive smoking history.  Will have him use prednisone course, cough suppression medications and albuterol inhaler.  He did get one in clinic and noticed significant difference in his breathing after using 2 puffs.  COVID 19 testing is pending. RTC precautions discussed. Counseled patient on potential for adverse effects with medications prescribed today, patient verbalized understanding.    Jaynee Eagles, [place] 05/27/19 1651

## 2019-05-29 LAB — NOVEL CORONAVIRUS, NAA (HOSP ORDER, SEND-OUT TO REF LAB; TAT 18-24 HRS): SARS-CoV-2, NAA: NOT DETECTED

## 2019-06-01 ENCOUNTER — Emergency Department (HOSPITAL_COMMUNITY)
Admission: EM | Admit: 2019-06-01 | Discharge: 2019-06-02 | Disposition: A | Discharge status: Home / Self Care | Payer: Self-pay | Attending: Emergency Medicine | Admitting: Emergency Medicine

## 2019-06-01 ENCOUNTER — Encounter (HOSPITAL_COMMUNITY): Payer: Self-pay | Admitting: Emergency Medicine

## 2019-06-01 ENCOUNTER — Other Ambulatory Visit: Payer: Self-pay

## 2019-06-01 DIAGNOSIS — Z79899 Other long term (current) drug therapy: Secondary | ICD-10-CM | POA: Insufficient documentation

## 2019-06-01 DIAGNOSIS — R0602 Shortness of breath: Secondary | ICD-10-CM | POA: Insufficient documentation

## 2019-06-01 DIAGNOSIS — F17210 Nicotine dependence, cigarettes, uncomplicated: Secondary | ICD-10-CM | POA: Insufficient documentation

## 2019-06-01 LAB — CBC
HCT: 41.7 % (ref 39.0–52.0)
Hemoglobin: 14.3 g/dL (ref 13.0–17.0)
MCH: 30.6 pg (ref 26.0–34.0)
MCHC: 34.3 g/dL (ref 30.0–36.0)
MCV: 89.1 fL (ref 80.0–100.0)
Platelets: 387 10*3/uL (ref 150–400)
RBC: 4.68 MIL/uL (ref 4.22–5.81)
RDW: 12.9 % (ref 11.5–15.5)
WBC: 8.8 10*3/uL (ref 4.0–10.5)
nRBC: 0 % (ref 0.0–0.2)

## 2019-06-01 NOTE — ED Triage Notes (Signed)
Per EMS, pt from home, c/o SOB and cough (producing white flem).  Reports it is worse after activity and smoking.  Initial RA was 77%.  Pt was given 5mg  albuterol neb, 125mg  Solumedrol O2 increased to 96%.   He tested last week as negative for COVID.    130/84 HR 80 Temp 98.4 RR 20

## 2019-06-02 ENCOUNTER — Emergency Department (HOSPITAL_COMMUNITY): Payer: Self-pay

## 2019-06-02 LAB — BASIC METABOLIC PANEL
Anion gap: 13 (ref 5–15)
BUN: 7 mg/dL (ref 6–20)
CO2: 19 mmol/L — ABNORMAL LOW (ref 22–32)
Calcium: 9 mg/dL (ref 8.9–10.3)
Chloride: 105 mmol/L (ref 98–111)
Creatinine, Ser: 0.72 mg/dL (ref 0.61–1.24)
GFR calc Af Amer: >60 mL/min (ref >60)
GFR calc non Af Amer: >60 mL/min (ref >60)
Glucose, Bld: 96 mg/dL (ref 70–99)
Potassium: 4 mmol/L (ref 3.5–5.1)
Sodium: 137 mmol/L (ref 135–145)

## 2019-06-02 MED ORDER — DEXAMETHASONE 4 MG PO TABS
6.0000 mg | ORAL_TABLET | Freq: Once | ORAL | Status: AC
  Administered 2019-06-02: 6 mg via ORAL
  Filled 2019-06-02: qty 2

## 2019-06-02 MED ORDER — ALBUTEROL SULFATE HFA 108 (90 BASE) MCG/ACT IN AERS: 2.0000 | INHALATION_SPRAY | RESPIRATORY_TRACT | 0 refills | Status: DC | PRN

## 2019-06-02 MED ORDER — MAGNESIUM SULFATE 2 GM/50ML IV SOLN
2.0000 g | INTRAVENOUS | Status: AC
  Administered 2019-06-02: 02:00:00 2 g via INTRAVENOUS
  Filled 2019-06-02: qty 50

## 2019-06-02 MED ORDER — ALBUTEROL SULFATE HFA 108 (90 BASE) MCG/ACT IN AERS: 2.0000 | INHALATION_SPRAY | Freq: Four times a day (QID) | RESPIRATORY_TRACT | Status: DC | PRN

## 2019-06-02 MED ORDER — PREDNISONE 20 MG PO TABS: ORAL_TABLET | ORAL | 0 refills | Status: DC

## 2019-06-02 NOTE — ED Provider Notes (Signed)
[facility] EMERGENCY DEPARTMENT Provider Note   CSN: [id number] Arrival date & time: 06/01/19  2234     History Chief Complaint  Patient presents with  . Shortness of Breath    Jesse Walters is a 49 y.o. male.  49 year old male presents to the emergency department for evaluation of shortness of breath.  Shortness of breath has been progressive and ongoing x1 month.  Symptoms associated with a cough.  Cough is mostly dry, though he produces some white phlegm at times.  Has been using an albuterol inhaler every 3-4 hours with some relief of his shortness of breath.  He received this from urgent care 6 days ago, but ran out of his inhaler tonight.  Developed a coughing fit with worsening shortness of breath prompting a call to EMS.  Sats were reportedly 77% on room air which improved following an albuterol nebulizer.  He was also given 125 mg Solu-Medrol.  Patient states that he was unable to fill his prescription for steroids and a cough suppressant prescribed following his urgent care visit.  He usually smokes 1 pack of cigarettes per day, but has only been able to smoke 5 cigarettes/day for the past month.  No associated fevers, syncope, chest pain, hemoptysis, leg swelling, recent surgeries or hospitalizations, sick contacts.  He desires f/u with a PCP as he does not currently have one.  The history is provided by the patient. No language interpreter was used.  Shortness of Breath      Past Medical History:  Diagnosis Date  . Seasonal allergic rhinitis     Patient Active Problem List   Diagnosis Date Noted  . Pain, dental 10/25/2013    Past Surgical History:  Procedure Laterality Date  . HERNIA REPAIR         Family History  Problem Relation Age of Onset  . Cancer Mother     Social History   Tobacco Use  . Smoking status: Current Every Day Smoker    Types: Cigarettes  . Smokeless tobacco: Never Used  . Tobacco comment: marijuana  Substance Use  Topics  . Alcohol use: Yes  . Drug use: Yes    Types: Marijuana    Home Medications Prior to Admission medications   Medication Sig Start Date End Date Taking? Authorizing Provider  albuterol (VENTOLIN HFA) 108 (90 Base) MCG/ACT inhaler Inhale 2 puffs into the lungs every 4 (four) hours as needed for wheezing or shortness of breath. 06/02/19   Antony Madura, PA-C  amLODipine (NORVASC) 5 MG tablet Take 1 tablet (5 mg total) by mouth daily. 05/27/19   Wallis Bamberg, PA-C  benzonatate (TESSALON) 100 MG capsule Take 1-2 capsules (100-200 mg total) by mouth 3 (three) times daily as needed. Patient taking differently: Take 100-200 mg by mouth 3 (three) times daily as needed for cough.  05/27/19   Wallis Bamberg, PA-C  predniSONE (DELTASONE) 20 MG tablet Take 2 tablets daily with breakfast. 06/02/19   Antony Madura, PA-C  promethazine-dextromethorphan (PROMETHAZINE-DM) 6.25-15 MG/5ML syrup Take 5 mLs by mouth at bedtime as needed for cough. 05/27/19   Wallis Bamberg, PA-C    Allergies    Dilaudid [hydromorphone hcl]  Review of Systems   Review of Systems  Respiratory: Positive for shortness of breath.   Ten systems reviewed and are negative for acute change, except as noted in the HPI.    Physical Exam Updated Vital Signs BP 125/83   Pulse 81   Temp 99.2 F (37.3 C) (Oral)  Resp (!) 24   Ht 5\' 10"  (1.778 m)   Wt 72.6 kg   SpO2 95%   BMI 22.96 kg/m   Physical Exam Vitals and nursing note reviewed.  Constitutional:      General: He is not in acute distress.    Appearance: He is well-developed. He is not diaphoretic.     Comments: Nontoxic appearing and in NAD  HENT:     Head: Normocephalic and atraumatic.  Eyes:     General: No scleral icterus.    Conjunctiva/sclera: Conjunctivae normal.  Cardiovascular:     Rate and Rhythm: Normal rate and regular rhythm.     Pulses: Normal pulses.  Pulmonary:     Effort: Pulmonary effort is normal. No respiratory distress.     Breath sounds: No  stridor. No wheezing, rhonchi or rales.     Comments: Lungs CTAB. Respirations even and unlabored. SpO2 96-98% on room air. Musculoskeletal:        General: Normal range of motion.     Cervical back: Normal range of motion.     Comments: No BLE edema.  Skin:    General: Skin is warm and dry.     Coloration: Skin is not pale.     Findings: No erythema or rash.  Neurological:     Mental Status: He is alert and oriented to person, place, and time.  Psychiatric:        Behavior: Behavior normal.     ED Results / Procedures / Treatments   Labs (all labs ordered are listed, but only abnormal results are displayed) Labs Reviewed  BASIC METABOLIC PANEL - Abnormal; Notable for the following components:      Result Value   CO2 19 (*)    All other components within normal limits  CBC    EKG EKG Interpretation  Date/Time:  Tuesday June 01 2019 23:01:23 EST Ventricular Rate:  91 PR Interval:  144 QRS Duration: 72 QT Interval:  340 QTC Calculation: 418 R Axis:   79 Text Interpretation: Normal sinus rhythm Normal ECG No significant change since last tracing Confirmed by Merrily Pew [phone]) on 06/02/2019 2:21:32 AM   Radiology DG Chest 2 View  Result Date: 06/02/2019 CLINICAL DATA:  Shortness of breath EXAM: CHEST - 2 VIEW COMPARISON:  05/27/2019 FINDINGS: Heart and mediastinal contours are within normal limits. No focal opacities or effusions. No acute bony abnormality. IMPRESSION: No active cardiopulmonary disease. Electronically Signed   By: Rolm Baptise M.D.   On: 06/02/2019 01:57    Procedures Procedures (including critical care time)  Medications Ordered in ED Medications  albuterol (VENTOLIN HFA) 108 (90 Base) MCG/ACT inhaler 2 puff (has no administration in time range)  magnesium sulfate IVPB 2 g 50 mL (0 g Intravenous Stopped 06/02/19 0334)  dexamethasone (DECADRON) tablet 6 mg (6 mg Oral Given 06/02/19 0218)    ED Course  I have reviewed the triage vital signs  and the nursing notes.  Pertinent labs & imaging results that were available during my care of the patient were reviewed by me and considered in my medical decision making (see chart for details).  3:00 AM Patient ambulated in the emergency department with sats at or above 90%.  Denies any shortness of breath with ambulation.  States that he is feeling better.  On repeat assessment, he is in no apparent distress, playing games on his cell phone.    MDM Rules/Calculators/A&P  49 year old male presents to the emergency department for 1 month of shortness of breath and cough productive of white phlegm.  On chart review, was seen in 2016 for similar complaints and diagnosed with presumed bronchitis.  He had a negative troponin and D-dimer during this evaluation.  Most recently presented to urgent care 6 days ago.  Was provided an albuterol inhaler which he ran out of tonight.  Never filled his prescription for prednisone or cough medications.  States that he was unable to afford these medicines.  Reportedly had hypoxia with EMS on their arrival.  He has not been hypoxic since ED arrival.  Was given 5 mg albuterol nebulizer in route to the ED as well as 125 mg Solu-Medrol.  This was supplemented with oral Decadron, IV magnesium.  Patient states that he is presently feeling much better.  His chest x-ray is stable and without acute cardiopulmonary abnormality; no pneumonia.  Suspect component of COPD secondary to smoking history.  He does not have a primary care doctor with whom he can follow-up.  Resource guide provided as well as information for the [facility].  Encouraged 5 day burst of prednisone as originally prescribed.  Return precautions discussed and provided. Patient discharged in stable condition with no unaddressed concerns.   Final Clinical Impression(s) / ED Diagnoses Final diagnoses:  SOB (shortness of breath)    Rx / DC Orders ED Discharge Orders         Ordered     predniSONE (DELTASONE) 20 MG tablet     06/02/19 0318    albuterol (VENTOLIN HFA) 108 (90 Base) MCG/ACT inhaler  Every 4 hours PRN     06/02/19 0318           Antony Madura, PA-C 06/02/19 0345    Mesner, Barbara Cower, MD 06/02/19 [phone]

## 2019-06-02 NOTE — ED Notes (Signed)
Patient ambulated around blue and orange nurses station. Oxygen dropped to 90% RA. Once back in bed, patient went back to 97%. States "I don't have shortness of breath I just feel like I need to cough. But I'm not short of breath."

## 2019-06-02 NOTE — Discharge Instructions (Signed)
Use 2 puffs of an albuterol inhaler every 4-6 hours as needed for persistent wheezing, shortness of breath.  Take prednisone as prescribed until finished.  We recommend that you discontinue smoking as this can worsen your symptoms.  You would benefit from follow-up with a primary care doctor.  [facility] to schedule primary care follow-up, though you may also reference the resource guide attached for additional primary care services in the area.  Return to the ED for any new or concerning symptoms.

## 2019-06-18 ENCOUNTER — Emergency Department (HOSPITAL_COMMUNITY)
Admission: EM | Admit: 2019-06-18 | Discharge: 2019-06-18 | Disposition: A | Discharge status: Home / Self Care | Payer: Self-pay | Attending: Emergency Medicine | Admitting: Emergency Medicine

## 2019-06-18 ENCOUNTER — Encounter (HOSPITAL_COMMUNITY): Payer: Self-pay

## 2019-06-18 ENCOUNTER — Emergency Department (HOSPITAL_COMMUNITY): Payer: Self-pay

## 2019-06-18 DIAGNOSIS — R059 Cough, unspecified: Secondary | ICD-10-CM

## 2019-06-18 DIAGNOSIS — F17210 Nicotine dependence, cigarettes, uncomplicated: Secondary | ICD-10-CM | POA: Insufficient documentation

## 2019-06-18 DIAGNOSIS — R0602 Shortness of breath: Secondary | ICD-10-CM | POA: Insufficient documentation

## 2019-06-18 DIAGNOSIS — R05 Cough: Secondary | ICD-10-CM | POA: Insufficient documentation

## 2019-06-18 DIAGNOSIS — R062 Wheezing: Secondary | ICD-10-CM | POA: Insufficient documentation

## 2019-06-18 LAB — CBC WITH DIFFERENTIAL/PLATELET
Abs Immature Granulocytes: 0.04 10*3/uL (ref 0.00–0.07)
Basophils Absolute: 0.1 10*3/uL (ref 0.0–0.1)
Basophils Relative: 1 %
Eosinophils Absolute: 0.4 10*3/uL (ref 0.0–0.5)
Eosinophils Relative: 3 %
HCT: 45.2 % (ref 39.0–52.0)
Hemoglobin: 15 g/dL (ref 13.0–17.0)
Immature Granulocytes: 0 %
Lymphocytes Relative: 22 %
Lymphs Abs: 2.8 10*3/uL (ref 0.7–4.0)
MCH: 30.2 pg (ref 26.0–34.0)
MCHC: 33.2 g/dL (ref 30.0–36.0)
MCV: 91.1 fL (ref 80.0–100.0)
Monocytes Absolute: 1.4 10*3/uL — ABNORMAL HIGH (ref 0.1–1.0)
Monocytes Relative: 11 %
Neutro Abs: 8 10*3/uL — ABNORMAL HIGH (ref 1.7–7.7)
Neutrophils Relative %: 63 %
Platelets: 276 10*3/uL (ref 150–400)
RBC: 4.96 MIL/uL (ref 4.22–5.81)
RDW: 13.4 % (ref 11.5–15.5)
WBC: 12.8 10*3/uL — ABNORMAL HIGH (ref 4.0–10.5)
nRBC: 0 % (ref 0.0–0.2)

## 2019-06-18 LAB — BASIC METABOLIC PANEL
Anion gap: 7 (ref 5–15)
BUN: 14 mg/dL (ref 6–20)
CO2: 26 mmol/L (ref 22–32)
Calcium: 8.7 mg/dL — ABNORMAL LOW (ref 8.9–10.3)
Chloride: 104 mmol/L (ref 98–111)
Creatinine, Ser: 0.73 mg/dL (ref 0.61–1.24)
GFR calc Af Amer: >60 mL/min (ref >60)
GFR calc non Af Amer: >60 mL/min (ref >60)
Glucose, Bld: 109 mg/dL — ABNORMAL HIGH (ref 70–99)
Potassium: 4.3 mmol/L (ref 3.5–5.1)
Sodium: 137 mmol/L (ref 135–145)

## 2019-06-18 MED ORDER — SODIUM CHLORIDE 0.9 % IV BOLUS
1000.0000 mL | Freq: Once | INTRAVENOUS | Status: AC
  Administered 2019-06-18: 12:00:00 1000 mL via INTRAVENOUS

## 2019-06-18 MED ORDER — PREDNISONE 20 MG PO TABS: 40.0000 mg | ORAL_TABLET | Freq: Every day | ORAL | 0 refills | Status: AC

## 2019-06-18 MED ORDER — METHYLPREDNISOLONE SODIUM SUCC 125 MG IJ SOLR
125.0000 mg | Freq: Once | INTRAMUSCULAR | Status: AC
  Administered 2019-06-18: 12:00:00 125 mg via INTRAVENOUS
  Filled 2019-06-18: qty 2

## 2019-06-18 MED ORDER — ALBUTEROL SULFATE HFA 108 (90 BASE) MCG/ACT IN AERS: 1.0000 | INHALATION_SPRAY | Freq: Four times a day (QID) | RESPIRATORY_TRACT | 3 refills | Status: DC | PRN

## 2019-06-18 MED ORDER — ALBUTEROL SULFATE HFA 108 (90 BASE) MCG/ACT IN AERS
6.0000 | INHALATION_SPRAY | Freq: Once | RESPIRATORY_TRACT | Status: AC
  Administered 2019-06-18: 12:00:00 6 via RESPIRATORY_TRACT
  Filled 2019-06-18: qty 6.7

## 2019-06-18 NOTE — ED Triage Notes (Signed)
Patient from home via EMS C/o SOB and productive cough  Patient had same thing last week, treated for it at [facility]  Hx: every day smoker  Denies chest pain and N/V   98% RA 158/94 92 HR 18 R

## 2019-06-18 NOTE — ED Provider Notes (Signed)
[facility]-EMERGENCY DEPT Provider Note   CSN: [id number] Arrival date & time: 06/18/19  1058     History Chief Complaint  Patient presents with   Cough   Shortness of Breath    Jesse Walters is a 49 y.o. male brought in by EMS for evaluation of shortness of breath and cough that began acutely at 3 AM.  He was seen on 06/02/19 in the ED for evaluation of similar symptoms.  At that time was treated with steroids and breathing treatments which he states improved his symptoms.  He was discharged home with one inhaler.  Patient states that his symptoms have been manageable since then.  He has been using the inhaler but ran out of it.  He states that the cough had improved but was still lingering.  He states that about 3 AM, his shortness of breath got significantly worse and he felt like he was wheezing and could not catch of breath.  He did not have any inhaler left to use.  He states he has a soreness and pain in his left chest and in his left back.  He states that he has not had any fevers.  He denies any recent travel, known Covid exposure.  He denies any abdominal pain, nausea/vomiting. He denies any exogenous hormone use, recent immobilization, prior history of DVT/PE, recent surgery, leg swelling, or long travel.  He does endorse that he smokes about 5 to 7 cigarettes a day.  The history is provided by the patient.       Past Medical History:  Diagnosis Date   Seasonal allergic rhinitis     Patient Active Problem List   Diagnosis Date Noted   Pain, dental 10/25/2013    Past Surgical History:  Procedure Laterality Date   HERNIA REPAIR         Family History  Problem Relation Age of Onset   Cancer Mother     Social History   Tobacco Use   Smoking status: Current Every Day Smoker    Types: Cigarettes   Smokeless tobacco: Never Used   Tobacco comment: marijuana  Substance Use Topics   Alcohol use: Yes   Drug use: Yes    Types:  Marijuana    Home Medications Prior to Admission medications   Medication Sig Start Date End Date Taking? Authorizing Provider  albuterol (VENTOLIN HFA) 108 (90 Base) MCG/ACT inhaler Inhale 1-2 puffs into the lungs every 6 (six) hours as needed for wheezing or shortness of breath. 06/18/19   Maxwell Caul, PA-C  amLODipine (NORVASC) 5 MG tablet Take 1 tablet (5 mg total) by mouth daily. 05/27/19   Wallis Bamberg, PA-C  benzonatate (TESSALON) 100 MG capsule Take 1-2 capsules (100-200 mg total) by mouth 3 (three) times daily as needed. Patient taking differently: Take 100-200 mg by mouth 3 (three) times daily as needed for cough.  05/27/19   Wallis Bamberg, PA-C  predniSONE (DELTASONE) 20 MG tablet Take 2 tablets (40 mg total) by mouth daily for 4 days. 06/18/19 06/22/19  Maxwell Caul, PA-C  promethazine-dextromethorphan (PROMETHAZINE-DM) 6.25-15 MG/5ML syrup Take 5 mLs by mouth at bedtime as needed for cough. 05/27/19   Wallis Bamberg, PA-C    Allergies    Dilaudid [hydromorphone hcl]  Review of Systems   Review of Systems  Constitutional: Negative for fever.  Respiratory: Positive for cough, shortness of breath and wheezing.   Cardiovascular: Negative for chest pain.  Gastrointestinal: Negative for abdominal pain, nausea and vomiting.  Genitourinary: Negative for dysuria and hematuria.  Neurological: Negative for headaches.  All other systems reviewed and are negative.   Physical Exam Updated Vital Signs BP 124/89    Pulse 95    Temp 98.5 F (36.9 C) (Oral)    Resp 15    Ht 5\' 10"  (1.778 m)    Wt 72.6 kg    SpO2 94%    BMI 22.96 kg/m   Physical Exam Vitals and nursing note reviewed.  Constitutional:      Appearance: Normal appearance. He is well-developed.     Comments: Appears uncomfortable but no acute distress  HENT:     Head: Normocephalic and atraumatic.  Eyes:     General: Lids are normal.     Conjunctiva/sclera: Conjunctivae normal.     Pupils: Pupils are equal, round,  and reactive to light.  Cardiovascular:     Rate and Rhythm: Regular rhythm. Tachycardia present.     Pulses: Normal pulses.     Heart sounds: Normal heart sounds. No murmur. No friction rub. No gallop.   Pulmonary:     Effort: Pulmonary effort is normal. Tachypnea present.     Breath sounds: Wheezing present.     Comments: Speaking in medium sentences with intermittent audible wheezing.  Wheezes noted throughout all lung fields.  Slightly decreased breath sounds at the apices bilaterally. Abdominal:     Palpations: Abdomen is soft. Abdomen is not rigid.     Tenderness: There is no abdominal tenderness. There is no guarding.     Comments: Lungs clear to auscultation bilaterally.  Symmetric chest rise.  No wheezing, rales, rhonchi.  Musculoskeletal:        General: Normal range of motion.     Cervical back: Full passive range of motion without pain.  Skin:    General: Skin is warm and dry.     Capillary Refill: Capillary refill takes less than 2 seconds.  Neurological:     Mental Status: He is alert and oriented to person, place, and time.  Psychiatric:        Speech: Speech normal.     ED Results / Procedures / Treatments   Labs (all labs ordered are listed, but only abnormal results are displayed) Labs Reviewed  BASIC METABOLIC PANEL - Abnormal; Notable for the following components:      Result Value   Glucose, Bld 109 (*)    Calcium 8.7 (*)    All other components within normal limits  CBC WITH DIFFERENTIAL/PLATELET - Abnormal; Notable for the following components:   WBC 12.8 (*)    Neutro Abs 8.0 (*)    Monocytes Absolute 1.4 (*)    All other components within normal limits    EKG None  Radiology DG Chest Portable 1 View  Result Date: 06/18/2019 CLINICAL DATA:  Cough and shortness of breath EXAM: PORTABLE CHEST 1 VIEW COMPARISON:  June 02, 2019 FINDINGS: Lungs are mildly hyperexpanded but clear. Heart size and pulmonary vascularity are normal. No adenopathy. No  bone lesions. IMPRESSION: Lungs mildly hyperexpanded but clear.  Stable cardiac silhouette. Electronically Signed   By: June 04, 2019 III M.D.   On: 06/18/2019 11:34    Procedures Procedures (including critical care time)  Medications Ordered in ED Medications  methylPREDNISolone sodium succinate (SOLU-MEDROL) 125 mg/2 mL injection 125 mg (125 mg Intravenous Given 06/18/19 1135)  albuterol (VENTOLIN HFA) 108 (90 Base) MCG/ACT inhaler 6 puff (6 puffs Inhalation Given 06/18/19 1137)  sodium chloride 0.9 % bolus 1,000 mL (  0 mLs Intravenous Stopped 06/18/19 1332)    ED Course  I have reviewed the triage vital signs and the nursing notes.  Pertinent labs & imaging results that were available during my care of the patient were reviewed by me and considered in my medical decision making (see chart for details).    MDM Rules/Calculators/A&P                      49 year old male who presents for evaluation of shortness of breath and cough.  Cough has been an ongoing issue but felt like it had slightly tapered down since his visit last week.  He states shortness of breath and cough became acutely worse at about 3 AM this morning.  He ran out of of the inhalers that he had previously been prescribed, prompting EMS call.  On initially arrival, he is afebrile, but appears uncomfortable.  He is slightly tachypneic and tachycardic.  O2 sats are 94% on room air.  During my discussion with him, he fluctuated between 92-94%.  On exam, he has obvious wheezing noted as well as some decreased breath sounds noted to the apices bilaterally.  Suspect that this is likely COPD exacerbation versus infectious process.  He has not been formally diagnosed with COPD but does have an extensive smoking history suspect there may be some component of COPD.  History/physical exam not concerning for PE.  We will plan for chest x-ray, steroids, albuterol.  CBC shows leukocytosis of 12.8.  BMP is unremarkable.  Chest x-ray shows  lungs mildly hyperexpanded but clear.  Reevaluation after prednisone, albuterol.  Patient is resting more comfortably.  He is able to speak in long sentences without any difficulty.  He endorses feeling better.  Repeat lung exam shows improvement in wheezing and decreased breath sounds.  Patient given additional albuterol.  We will plan to ambulate patient.  Patient ambulated with O2 sats maintaining 100%.  Patient reports feeling much better.  I suspect he has some degree of COPD.  We will plan to repeat inhalers, steroids.  Patient given referral to [facility] wellness clinic. At this time, patient exhibits no emergent life-threatening condition that require further evaluation in ED or admission. Patient had ample opportunity for questions and discussion. All patient's questions were answered with full understanding. Strict return precautions discussed. Patient expresses understanding and agreement to plan.   Portions of this note were generated with [employer]. Dictation errors may occur despite best attempts at proofreading.   Final Clinical Impression(s) / ED Diagnoses Final diagnoses:  Shortness of breath  Wheezing  Cough    Rx / DC Orders ED Discharge Orders         Ordered    predniSONE (DELTASONE) 20 MG tablet  Daily     06/18/19 1432    albuterol (VENTOLIN HFA) 108 (90 Base) MCG/ACT inhaler  Every 6 hours PRN     06/18/19 1432           Volanda Napoleon, PA-C 06/18/19 1654    Sherwood Gambler, MD 06/19/19 [phone]

## 2019-06-18 NOTE — ED Notes (Signed)
After ambulating to bathroom independently, patient SpO2 is 100%. Patient states that he feels a lot better.

## 2019-06-18 NOTE — Discharge Instructions (Signed)
Take prednisone use albuterol inhaler as directed.  As we discussed, you need to establish a primary care doctor.  I put in a referral for the [facility] wellness clinic and have asked case worker to help establish you.  I provided you inhaler refills.  Return the emergency department for any worsening difficulty breathing, fevers, cough or any other worsening concerning symptoms.

## 2019-06-21 ENCOUNTER — Telehealth: Payer: Self-pay | Admitting: *Deleted

## 2019-06-21 NOTE — Telephone Encounter (Signed)
TOC CM attempted call to pt but no phone in system. Contacted sister, Malka So and left a HIPAA compliant message in the voice mail for a return call. Appt arranged at [facility] for 07/09/2019 at 2:30 pm. Will mail out appointment time to patient. Referring ED provider updated. Isidoro Donning RN CCM, [facility] ED TOC CM [phone]

## 2019-06-26 ENCOUNTER — Emergency Department (HOSPITAL_COMMUNITY)
Admission: EM | Admit: 2019-06-26 | Discharge: 2019-06-26 | Disposition: A | Discharge status: Home / Self Care | Payer: Self-pay | Attending: Emergency Medicine | Admitting: Emergency Medicine

## 2019-06-26 ENCOUNTER — Emergency Department (HOSPITAL_COMMUNITY): Payer: Self-pay

## 2019-06-26 ENCOUNTER — Other Ambulatory Visit: Payer: Self-pay

## 2019-06-26 DIAGNOSIS — Z79899 Other long term (current) drug therapy: Secondary | ICD-10-CM | POA: Insufficient documentation

## 2019-06-26 DIAGNOSIS — R0602 Shortness of breath: Secondary | ICD-10-CM | POA: Insufficient documentation

## 2019-06-26 DIAGNOSIS — Z20822 Contact with and (suspected) exposure to covid-19: Secondary | ICD-10-CM | POA: Insufficient documentation

## 2019-06-26 LAB — CBC WITH DIFFERENTIAL/PLATELET
Abs Immature Granulocytes: 0.02 10*3/uL (ref 0.00–0.07)
Basophils Absolute: 0.1 10*3/uL (ref 0.0–0.1)
Basophils Relative: 1 %
Eosinophils Absolute: 0.4 10*3/uL (ref 0.0–0.5)
Eosinophils Relative: 6 %
HCT: 44.4 % (ref 39.0–52.0)
Hemoglobin: 15 g/dL (ref 13.0–17.0)
Immature Granulocytes: 0 %
Lymphocytes Relative: 19 %
Lymphs Abs: 1.4 10*3/uL (ref 0.7–4.0)
MCH: 30.3 pg (ref 26.0–34.0)
MCHC: 33.8 g/dL (ref 30.0–36.0)
MCV: 89.7 fL (ref 80.0–100.0)
Monocytes Absolute: 0.5 10*3/uL (ref 0.1–1.0)
Monocytes Relative: 6 %
Neutro Abs: 4.9 10*3/uL (ref 1.7–7.7)
Neutrophils Relative %: 68 %
Platelets: 293 10*3/uL (ref 150–400)
RBC: 4.95 MIL/uL (ref 4.22–5.81)
RDW: 13.3 % (ref 11.5–15.5)
WBC: 7.3 10*3/uL (ref 4.0–10.5)
nRBC: 0 % (ref 0.0–0.2)

## 2019-06-26 LAB — BASIC METABOLIC PANEL
Anion gap: 5 (ref 5–15)
BUN: 11 mg/dL (ref 6–20)
CO2: 26 mmol/L (ref 22–32)
Calcium: 8.7 mg/dL — ABNORMAL LOW (ref 8.9–10.3)
Chloride: 107 mmol/L (ref 98–111)
Creatinine, Ser: 0.76 mg/dL (ref 0.61–1.24)
GFR calc Af Amer: >60 mL/min (ref >60)
GFR calc non Af Amer: >60 mL/min (ref >60)
Glucose, Bld: 101 mg/dL — ABNORMAL HIGH (ref 70–99)
Potassium: 5.6 mmol/L — ABNORMAL HIGH (ref 3.5–5.1)
Sodium: 138 mmol/L (ref 135–145)

## 2019-06-26 LAB — SARS CORONAVIRUS 2 (TAT 6-24 HRS): SARS Coronavirus 2: NEGATIVE

## 2019-06-26 LAB — POTASSIUM: Potassium: 4.2 mmol/L (ref 3.5–5.1)

## 2019-06-26 MED ORDER — DEXAMETHASONE SODIUM PHOSPHATE 10 MG/ML IJ SOLN
10.0000 mg | Freq: Once | INTRAMUSCULAR | Status: AC
  Administered 2019-06-26: 14:00:00 10 mg via INTRAVENOUS
  Filled 2019-06-26: qty 1

## 2019-06-26 MED ORDER — ALBUTEROL SULFATE HFA 108 (90 BASE) MCG/ACT IN AERS: 2.0000 | INHALATION_SPRAY | Freq: Four times a day (QID) | RESPIRATORY_TRACT | 1 refills | Status: DC | PRN

## 2019-06-26 MED ORDER — PREDNISONE 20 MG PO TABS: 40.0000 mg | ORAL_TABLET | Freq: Every day | ORAL | 0 refills | Status: AC

## 2019-06-26 MED ORDER — ALBUTEROL SULFATE HFA 108 (90 BASE) MCG/ACT IN AERS
4.0000 | INHALATION_SPRAY | Freq: Once | RESPIRATORY_TRACT | Status: AC
  Administered 2019-06-26: 14:00:00 4 via RESPIRATORY_TRACT
  Filled 2019-06-26: qty 6.7

## 2019-06-26 MED ORDER — SODIUM CHLORIDE 0.9 % IV BOLUS
1000.0000 mL | Freq: Once | INTRAVENOUS | Status: AC
  Administered 2019-06-26: 16:00:00 1000 mL via INTRAVENOUS

## 2019-06-26 MED ORDER — AEROCHAMBER PLUS FLO-VU MEDIUM MISC
1.0000 | Freq: Once | Status: AC
  Administered 2019-06-26: 14:00:00 1
  Filled 2019-06-26: qty 1

## 2019-06-26 NOTE — ED Triage Notes (Signed)
Pt to ED by [facility] with c/o of SOB. Pt has hx of COPD and has not been able to get any of his at home medications. Pt states was tested for COVID on 2/12 with a negative result.

## 2019-06-26 NOTE — ED Notes (Signed)
Patient ambulated with this RN, SpO2 did not drop below 95%

## 2019-06-26 NOTE — Discharge Instructions (Signed)
As discussed, all of your labs and EKG looked good today. I am refilling your albuterol prescription and sending you home with steroids for the next 5 days. Take as prescribed. Always use spacer with albuterol. Call to schedule an appointment with [facility] to schedule an appointment. Return to the ER for new or worsening symptoms.

## 2019-06-26 NOTE — ED Provider Notes (Signed)
[facility]-EMERGENCY DEPT Provider Note   CSN: [id number] Arrival date & time: 06/26/19  1249     History Chief Complaint  Patient presents with  . Shortness of Breath    Jesse Walters is a 49 y.o. male with no significant past medical history who presents to the ED via EMS due to sudden onset of shortness of breath that started at 6 AM this morning.  Patient admits to associative productive cough with white phlegm and wheeze.  Shortness of breath worse when lying flat. Chart reviewed.  Patient has been seen on 2/12, 1/26, and 1/21 for the same complaint.  Patient denies chest pain.  Patient denies history of blood clots, recent surgeries, recent long immobilizations, and hormonal treatments.  Patient has tried Tylenol for his symptoms with no relief.  Denies sick contacts and Covid exposures. Patient admits to smoking 4-5 cigarettes daily. He has never been diagnosed with COPD or asthma, but has no PCP. Patient notes his inhaler he received from the ED ran out yesterday which he attributes to his new onset of shortness of breath. He admits to using his inhaler roughly twice a day. Patient also admits to soreness in his left back which he notes occurs each time this happens. Patient denies fever, chills, abdominal pain, nausea, vomiting, and diarrhea.     Past Medical History:  Diagnosis Date  . Seasonal allergic rhinitis     Patient Active Problem List   Diagnosis Date Noted  . Pain, dental 10/25/2013    Past Surgical History:  Procedure Laterality Date  . HERNIA REPAIR         Family History  Problem Relation Age of Onset  . Cancer Mother     Social History   Tobacco Use  . Smoking status: Current Every Day Smoker    Types: Cigarettes  . Smokeless tobacco: Never Used  . Tobacco comment: marijuana  Substance Use Topics  . Alcohol use: Yes  . Drug use: Yes    Types: Marijuana    Home Medications Prior to Admission medications   Medication Sig  Start Date End Date Taking? Authorizing Provider  albuterol (VENTOLIN HFA) 108 (90 Base) MCG/ACT inhaler Inhale 1-2 puffs into the lungs every 6 (six) hours as needed for wheezing or shortness of breath. 06/18/19  Yes Maxwell Caul, PA-C  albuterol (VENTOLIN HFA) 108 (90 Base) MCG/ACT inhaler Inhale 2 puffs into the lungs every 6 (six) hours as needed for wheezing or shortness of breath. 06/26/19   Mannie Stabile, PA-C  amLODipine (NORVASC) 5 MG tablet Take 1 tablet (5 mg total) by mouth daily. 05/27/19   Wallis Bamberg, PA-C  benzonatate (TESSALON) 100 MG capsule Take 1-2 capsules (100-200 mg total) by mouth 3 (three) times daily as needed. Patient not taking: Reported on 06/26/2019 05/27/19   Wallis Bamberg, PA-C  predniSONE (DELTASONE) 20 MG tablet Take 2 tablets (40 mg total) by mouth daily for 5 days. 06/26/19 07/01/19  Mannie Stabile, PA-C  promethazine-dextromethorphan (PROMETHAZINE-DM) 6.25-15 MG/5ML syrup Take 5 mLs by mouth at bedtime as needed for cough. Patient not taking: Reported on 06/26/2019 05/27/19   Wallis Bamberg, PA-C    Allergies    Dilaudid [hydromorphone hcl]  Review of Systems   Review of Systems  Constitutional: Negative for chills and fever.  Respiratory: Positive for cough, shortness of breath and wheezing.   Cardiovascular: Negative for chest pain and leg swelling.  Gastrointestinal: Negative for abdominal pain, diarrhea, nausea and vomiting.  Musculoskeletal:  Positive for back pain.  Neurological: Negative for headaches.  All other systems reviewed and are negative.   Physical Exam Updated Vital Signs BP 123/79   Pulse 88   Temp 97.9 F (36.6 C) (Oral)   Resp (!) 28   Ht 5\' 10"  (1.778 m)   Wt 72.6 kg   SpO2 95%   BMI 22.96 kg/m   Physical Exam Vitals and nursing note reviewed.  Constitutional:      General: He is not in acute distress.    Appearance: He is not toxic-appearing.  HENT:     Head: Normocephalic.  Eyes:     Pupils: Pupils are equal,  round, and reactive to light.  Cardiovascular:     Rate and Rhythm: Regular rhythm. Tachycardia present.     Pulses: Normal pulses.     Heart sounds: Normal heart sounds. No murmur. No friction rub. No gallop.   Pulmonary:     Effort: Pulmonary effort is normal.     Breath sounds: Normal breath sounds.     Comments: Wheeze heard throughout. Speaking in broken up sentences. No accessory muscle usage.  Abdominal:     General: Abdomen is flat. Bowel sounds are normal. There is no distension.     Palpations: Abdomen is soft.     Tenderness: There is no abdominal tenderness. There is no guarding or rebound.  Musculoskeletal:     Cervical back: Neck supple.     Comments: Able to move all 4 extremities without difficulty. No lower extremity edema. Negative homans sign bilaterally.   Skin:    General: Skin is warm and dry.  Neurological:     General: No focal deficit present.     Mental Status: He is alert.  Psychiatric:        Mood and Affect: Mood normal.        Behavior: Behavior normal.     ED Results / Procedures / Treatments   Labs (all labs ordered are listed, but only abnormal results are displayed) Labs Reviewed  BASIC METABOLIC PANEL - Abnormal; Notable for the following components:      Result Value   Potassium 5.6 (*)    Glucose, Bld 101 (*)    Calcium 8.7 (*)    All other components within normal limits  SARS CORONAVIRUS 2 (TAT 6-24 HRS)  CBC WITH DIFFERENTIAL/PLATELET  POTASSIUM    EKG EKG Interpretation  Date/Time:  Saturday June 26 2019 13:03:28 EST Ventricular Rate:  107 PR Interval:    QRS Duration: 86 QT Interval:  330 QTC Calculation: 441 R Axis:   79 Text Interpretation: Sinus tachycardia Right atrial enlargement Anteroseptal infarct, age indeterminate Confirmed by 07-03-1978 [phone]) on 06/26/2019 1:58:25 PM   Radiology DG Chest 2 View  Result Date: 06/26/2019 CLINICAL DATA:  49 year old male with shortness of breath. EXAM: CHEST - 2  VIEW COMPARISON:  Chest x-ray 06/18/2019. FINDINGS: Lung volumes are normal. No consolidative airspace disease. No pleural effusions. No pneumothorax. No pulmonary nodule or mass noted. Pulmonary vasculature and the cardiomediastinal silhouette are within normal limits. IMPRESSION: No radiographic evidence of acute cardiopulmonary disease. Electronically Signed   By: 08/16/2019 M.D.   On: 06/26/2019 14:34    Procedures Procedures (including critical care time)  Medications Ordered in ED Medications  albuterol (VENTOLIN HFA) 108 (90 Base) MCG/ACT inhaler 4 puff (4 puffs Inhalation Given 06/26/19 1340)  AeroChamber Plus Flo-Vu Medium MISC 1 each (1 each Other Given 06/26/19 1428)  dexamethasone (DECADRON) injection 10 mg (  10 mg Intravenous Given 06/26/19 1427)  sodium chloride 0.9 % bolus 1,000 mL (1,000 mLs Intravenous New Bag/Given 06/26/19 1625)    ED Course  I have reviewed the triage vital signs and the nursing notes.  Pertinent labs & imaging results that were available during my care of the patient were reviewed by me and considered in my medical decision making (see chart for details).  Clinical Course as of Jun 25 1824  Sat Jun 26, 2019  1607 Reassessed patient at bedside who appears to be much more comfortable. Work of breathing has improved. O2 saturation at 96% on room air.    [CA]  1607 Potassium(!): 5.6 [CA]    Clinical Course User Index [CA] Suzy Bouchard, PA-C   MDM Rules/Calculators/A&P                     49 year old male presents to the ED due to shortness of breath and cough that started this morning.  Chart reviewed.  Shortness of breath and cough have been an ongoing issue over the past 2 months.  Upon arrival, patient is tachycardic at 106 and tachypneic at 22 with O2 saturation at 97% on 10L [place]. He is afebrile. I turned off oxygen during my initial evaluation and patient maintained O2 saturation between 97-100%. Patient has not been formally diagnosed with  COPD or asthma, but has no PCP currently. He has an extensive smoking history. Low suspicion for PE given this appears to be more of a chronic issue. Will plan routine labs, CXR, steroids, COVID test, and albuterol.   CBC reassuring with no leukocytosis.  BMP significant for hyperkalemia at 5.6.  EKG personally reviewed which demonstrates sinus tachycardia with possible peaked T-waves; however, reviewed past EKGs which demonstrated peaked T-waves as well with no previous hyperkalemia. Patient is currently asymptomatic. Will give IVFs and recheck potassium level. Discussed case with Dr. Alvino Chapel who agrees with assessment and plan. Patient ambulated here in the ED and maintained O2 saturation above 95% the entire time without any difficulties. Repeat potassium normal. Will discharge patient with albuterol and prednisone. Advised patient to follow-up with PCP within the next week for further evaluation. Strict ED precautions discussed with patient. Patient states understanding and agrees to plan. Patient discharged home in no acute distress and stable vitals.  Final Clinical Impression(s) / ED Diagnoses Final diagnoses:  Shortness of breath    Rx / DC Orders ED Discharge Orders         Ordered    albuterol (VENTOLIN HFA) 108 (90 Base) MCG/ACT inhaler  Every 6 hours PRN     06/26/19 1824    predniSONE (DELTASONE) 20 MG tablet  Daily     06/26/19 1824           Karie Kirks 06/26/19 Danelle Berry, MD 06/26/19 2029

## 2019-07-02 ENCOUNTER — Inpatient Hospital Stay (HOSPITAL_COMMUNITY)
Admission: EM | Admit: 2019-07-02 | Discharge: 2019-07-05 | DRG: 190 | Disposition: A | Discharge status: Home / Self Care | Payer: Self-pay | Attending: Internal Medicine | Admitting: Internal Medicine

## 2019-07-02 ENCOUNTER — Emergency Department (HOSPITAL_COMMUNITY): Payer: Self-pay

## 2019-07-02 ENCOUNTER — Encounter (HOSPITAL_COMMUNITY): Payer: Self-pay

## 2019-07-02 ENCOUNTER — Other Ambulatory Visit: Payer: Self-pay

## 2019-07-02 DIAGNOSIS — J302 Other seasonal allergic rhinitis: Secondary | ICD-10-CM | POA: Diagnosis present

## 2019-07-02 DIAGNOSIS — J9621 Acute and chronic respiratory failure with hypoxia: Secondary | ICD-10-CM | POA: Diagnosis present

## 2019-07-02 DIAGNOSIS — J449 Chronic obstructive pulmonary disease, unspecified: Secondary | ICD-10-CM | POA: Diagnosis present

## 2019-07-02 DIAGNOSIS — J441 Chronic obstructive pulmonary disease with (acute) exacerbation: Principal | ICD-10-CM | POA: Diagnosis present

## 2019-07-02 DIAGNOSIS — Z20822 Contact with and (suspected) exposure to covid-19: Secondary | ICD-10-CM | POA: Diagnosis present

## 2019-07-02 DIAGNOSIS — R079 Chest pain, unspecified: Secondary | ICD-10-CM

## 2019-07-02 DIAGNOSIS — Z720 Tobacco use: Secondary | ICD-10-CM | POA: Diagnosis present

## 2019-07-02 DIAGNOSIS — Z885 Allergy status to narcotic agent status: Secondary | ICD-10-CM

## 2019-07-02 DIAGNOSIS — F17210 Nicotine dependence, cigarettes, uncomplicated: Secondary | ICD-10-CM | POA: Diagnosis present

## 2019-07-02 DIAGNOSIS — R000 Tachycardia, unspecified: Secondary | ICD-10-CM

## 2019-07-02 DIAGNOSIS — R0602 Shortness of breath: Secondary | ICD-10-CM

## 2019-07-02 LAB — D-DIMER, QUANTITATIVE: D-Dimer, Quant: 0.37 ug/mL-FEU (ref 0.00–0.50)

## 2019-07-02 LAB — COMPREHENSIVE METABOLIC PANEL
ALT: 17 U/L (ref 0–44)
AST: 17 U/L (ref 15–41)
Albumin: 3.4 g/dL — ABNORMAL LOW (ref 3.5–5.0)
Alkaline Phosphatase: 62 U/L (ref 38–126)
Anion gap: 10 (ref 5–15)
BUN: 11 mg/dL (ref 6–20)
CO2: 22 mmol/L (ref 22–32)
Calcium: 8.6 mg/dL — ABNORMAL LOW (ref 8.9–10.3)
Chloride: 107 mmol/L (ref 98–111)
Creatinine, Ser: 0.82 mg/dL (ref 0.61–1.24)
GFR calc Af Amer: >60 mL/min (ref >60)
GFR calc non Af Amer: >60 mL/min (ref >60)
Glucose, Bld: 102 mg/dL — ABNORMAL HIGH (ref 70–99)
Potassium: 4 mmol/L (ref 3.5–5.1)
Sodium: 139 mmol/L (ref 135–145)
Total Bilirubin: 0.8 mg/dL (ref 0.3–1.2)
Total Protein: 5.9 g/dL — ABNORMAL LOW (ref 6.5–8.1)

## 2019-07-02 LAB — CBC WITH DIFFERENTIAL/PLATELET
Abs Immature Granulocytes: 0.04 10*3/uL (ref 0.00–0.07)
Basophils Absolute: 0.1 10*3/uL (ref 0.0–0.1)
Basophils Relative: 1 %
Eosinophils Absolute: 0.3 10*3/uL (ref 0.0–0.5)
Eosinophils Relative: 4 %
HCT: 41.7 % (ref 39.0–52.0)
Hemoglobin: 13.7 g/dL (ref 13.0–17.0)
Immature Granulocytes: 1 %
Lymphocytes Relative: 14 %
Lymphs Abs: 1.1 10*3/uL (ref 0.7–4.0)
MCH: 30.1 pg (ref 26.0–34.0)
MCHC: 32.9 g/dL (ref 30.0–36.0)
MCV: 91.6 fL (ref 80.0–100.0)
Monocytes Absolute: 0.3 10*3/uL (ref 0.1–1.0)
Monocytes Relative: 3 %
Neutro Abs: 6.3 10*3/uL (ref 1.7–7.7)
Neutrophils Relative %: 77 %
Platelets: 354 10*3/uL (ref 150–400)
RBC: 4.55 MIL/uL (ref 4.22–5.81)
RDW: 13.1 % (ref 11.5–15.5)
WBC: 8.1 10*3/uL (ref 4.0–10.5)
nRBC: 0 % (ref 0.0–0.2)

## 2019-07-02 LAB — RESPIRATORY PANEL BY RT PCR (FLU A&B, COVID)
Influenza A by PCR: NEGATIVE
Influenza B by PCR: NEGATIVE
SARS Coronavirus 2 by RT PCR: NEGATIVE

## 2019-07-02 LAB — TROPONIN I (HIGH SENSITIVITY)
Troponin I (High Sensitivity): 3 ng/L (ref <18)
Troponin I (High Sensitivity): 3 ng/L (ref <18)

## 2019-07-02 LAB — LIPASE, BLOOD: Lipase: 16 U/L (ref 11–51)

## 2019-07-02 LAB — LACTIC ACID, PLASMA: Lactic Acid, Venous: 1.5 mmol/L (ref 0.5–1.9)

## 2019-07-02 MED ORDER — ALBUTEROL SULFATE HFA 108 (90 BASE) MCG/ACT IN AERS
2.0000 | INHALATION_SPRAY | Freq: Once | RESPIRATORY_TRACT | Status: AC
  Administered 2019-07-02: 2 via RESPIRATORY_TRACT
  Filled 2019-07-02: qty 6.7

## 2019-07-02 MED ORDER — LEVOFLOXACIN IN D5W 750 MG/150ML IV SOLN
750.0000 mg | INTRAVENOUS | Status: DC
  Administered 2019-07-03 – 2019-07-04 (×2): 750 mg via INTRAVENOUS
  Filled 2019-07-02 (×2): qty 150

## 2019-07-02 MED ORDER — NICOTINE 21 MG/24HR TD PT24
21.0000 mg | MEDICATED_PATCH | Freq: Every day | TRANSDERMAL | Status: DC
  Filled 2019-07-02 (×2): qty 1

## 2019-07-02 MED ORDER — IPRATROPIUM BROMIDE HFA 17 MCG/ACT IN AERS
2.0000 | INHALATION_SPRAY | Freq: Once | RESPIRATORY_TRACT | Status: AC
  Administered 2019-07-02: 2 via RESPIRATORY_TRACT
  Filled 2019-07-02: qty 12.9

## 2019-07-02 NOTE — ED Triage Notes (Addendum)
Pt here from home via [facility] for SOB. Fire dept found pt w/ sat 91% on RA, tripod position, increased WOB, bilateral wheezing, gave a nebulizer. EMS gave 0.3mg  epi IM, 125mg  solumedrol IV, 2g Mag IV, 5mg  albuterol. Pt hx of COPD. Pt reports L rib pain. Pt O2 s at 98% on 2L [place], VSS. AOx4

## 2019-07-02 NOTE — ED Notes (Signed)
Pt ambulated around room for approximately 2 minutes. O2sat began in low 90s, 91 sitting, 90-92 while first ambulating. After ambulating for approx. 1 minute, O2sat rose and remained between 94 and 95 for the remainder of ambulation.

## 2019-07-02 NOTE — H&P (Signed)
History and Physical   Medford Staheli ZOX:[id number] DOB: 08-Dec-1970 DOA: 07/02/2019  Referring MD/NP/PA: Dr Rush Landmark  PCP: Patient, No Pcp Per   Outpatient Specialists: None   Patient coming from: Home  Chief Complaint: Shortness of breath  HPI: Jesse Walters is a 49 y.o. male with medical history significant of seasonal allergies and tobacco abuse who presented with sudden onset of shortness of breath and cough.  Patient went to the local fire department where he was initiated on breathing treatment when he was found to be hypoxic and wheezing excessively.  He was seen at [facility] a week ago with shortness of breath.  He reported history of COPD but not able to take any of his medications at the time.  Patient was seen evaluated and treated.  He was discharged home with breathing treatments.  Today however he came to the ER where he has been excessively wheezing.  Treatment with steroids and breathing treatments was only minimal relief.  He was having oxygen saturation in the 80s.  Patient admitted to the hospital for further work-up.  ED Course: Temperature 98.7 blood pressure 153/77 pulse 101 respirate 28 oxygen sat 87% on room air.  CBC and chemistry entirely within normal.  Chest x-ray showed no acute findings.  Patient appears to have excessive wheezing with COPD exacerbation and is being admitted to the hospital for further treatment.  Review of Systems: As per HPI otherwise 10 point review of systems negative.    Past Medical History:  Diagnosis Date  . Seasonal allergic rhinitis     Past Surgical History:  Procedure Laterality Date  . HERNIA REPAIR       reports that he has been smoking cigarettes. He has never used smokeless tobacco. He reports current alcohol use of about 7.0 standard drinks of alcohol per week. He reports previous drug use. Drug: Marijuana.  Allergies  Allergen Reactions  . Dilaudid [Hydromorphone Hcl] Hives    Family History  Problem  Relation Age of Onset  . Cancer Mother      Prior to Admission medications   Medication Sig Start Date End Date Taking? Authorizing Provider  acetaminophen (TYLENOL) 500 MG tablet Take 1,000 mg by mouth every 6 (six) hours as needed (for inflammation).   Yes [provider]  albuterol (PROVENTIL) (2.5 MG/3ML) 0.083% nebulizer solution Take 2.5 mg by nebulization every 6 (six) hours as needed for wheezing or shortness of breath.   Yes [provider]  albuterol (VENTOLIN HFA) 108 (90 Base) MCG/ACT inhaler Inhale 1-2 puffs into the lungs every 6 (six) hours as needed for wheezing or shortness of breath. Patient not taking: Reported on 07/02/2019 06/18/19   Graciella Freer A, PA-C  albuterol (VENTOLIN HFA) 108 (90 Base) MCG/ACT inhaler Inhale 2 puffs into the lungs every 6 (six) hours as needed for wheezing or shortness of breath. Patient not taking: Reported on 07/02/2019 06/26/19   Mannie Stabile, PA-C  amLODipine (NORVASC) 5 MG tablet Take 1 tablet (5 mg total) by mouth daily. Patient not taking: Reported on 07/02/2019 05/27/19   Wallis Bamberg, PA-C  benzonatate (TESSALON) 100 MG capsule Take 1-2 capsules (100-200 mg total) by mouth 3 (three) times daily as needed. Patient not taking: Reported on 07/02/2019 05/27/19   Wallis Bamberg, PA-C  promethazine-dextromethorphan (PROMETHAZINE-DM) 6.25-15 MG/5ML syrup Take 5 mLs by mouth at bedtime as needed for cough. Patient not taking: Reported on 07/02/2019 05/27/19   Wallis Bamberg, PA-C    Physical Exam: Vitals:  07/02/19 1900 07/02/19 1945 07/02/19 2030 07/02/19 2215  BP: 128/87 113/70 131/77 108/69  Pulse:   88 98  Resp:      Temp:      TempSrc:      SpO2: 93%  95% (!) 87%  Weight:      Height:          Constitutional: Anxious but no acute distress Vitals:   07/02/19 1900 07/02/19 1945 07/02/19 2030 07/02/19 2215  BP: 128/87 113/70 131/77 108/69  Pulse:   88 98  Resp:      Temp:      TempSrc:      SpO2: 93%  95% (!) 87%   Weight:      Height:       Eyes: PERRL, lids and conjunctivae normal ENMT: Mucous membranes are moist. Posterior pharynx clear of any exudate or lesions.Normal dentition.  Neck: normal, supple, no masses, no thyromegaly Respiratory: Decreased air entry bilaterally with marked expiratory wheezing and increased respiratory drive with extra muscle of respiration use Cardiovascular: Sinus tachycardia, no murmurs / rubs / gallops. No extremity edema. 2+ pedal pulses. No carotid bruits.  Abdomen: no tenderness, no masses palpated. No hepatosplenomegaly. Bowel sounds positive.  Musculoskeletal: no clubbing / cyanosis. No joint deformity upper and lower extremities. Good ROM, no contractures. Normal muscle tone.  Skin: no rashes, lesions, ulcers. No induration Neurologic: CN 2-12 grossly intact. Sensation intact, DTR normal. Strength 5/5 in all 4.  Psychiatric: Normal judgment and insight. Alert and oriented x 3. Normal mood.     Labs on Admission: I have personally reviewed following labs and imaging studies  CBC: Recent Labs  Lab 06/26/19 1341 07/02/19 1543  WBC 7.3 8.1  NEUTROABS 4.9 6.3  HGB 15.0 13.7  HCT 44.4 41.7  MCV 89.7 91.6  PLT 293 354   Basic Metabolic Panel: Recent Labs  Lab 06/26/19 1341 06/26/19 1706 07/02/19 1543  NA 138  --  139  K 5.6* 4.2 4.0  CL 107  --  107  CO2 26  --  22  GLUCOSE 101*  --  102*  BUN 11  --  11  CREATININE 0.76  --  0.82  CALCIUM 8.7*  --  8.6*   GFR: Estimated Creatinine Clearance: 109.3 mL/min (by C-G formula based on SCr of 0.82 mg/dL). Liver Function Tests: Recent Labs  Lab 07/02/19 1543  AST 17  ALT 17  ALKPHOS 62  BILITOT 0.8  PROT 5.9*  ALBUMIN 3.4*   Recent Labs  Lab 07/02/19 1543  LIPASE 16   No results for input(s): AMMONIA in the last 168 hours. Coagulation Profile: No results for input(s): INR, PROTIME in the last 168 hours. Cardiac Enzymes: No results for input(s): CKTOTAL, CKMB, CKMBINDEX, TROPONINI in  the last 168 hours. BNP (last 3 results) No results for input(s): PROBNP in the last 8760 hours. HbA1C: No results for input(s): HGBA1C in the last 72 hours. CBG: No results for input(s): GLUCAP in the last 168 hours. Lipid Profile: No results for input(s): CHOL, HDL, LDLCALC, TRIG, CHOLHDL, LDLDIRECT in the last 72 hours. Thyroid Function Tests: No results for input(s): TSH, T4TOTAL, FREET4, T3FREE, THYROIDAB in the last 72 hours. Anemia Panel: No results for input(s): VITAMINB12, FOLATE, FERRITIN, TIBC, IRON, RETICCTPCT in the last 72 hours. Urine analysis: No results found for: COLORURINE, APPEARANCEUR, LABSPEC, PHURINE, GLUCOSEU, HGBUR, BILIRUBINUR, KETONESUR, PROTEINUR, UROBILINOGEN, NITRITE, LEUKOCYTESUR Sepsis Labs: @LABRCNTIP (procalcitonin:4,lacticidven:4) ) Recent Results (from the past 240 hour(s))  SARS CORONAVIRUS 2 (TAT 6-24  HRS) Nasopharyngeal Nasopharyngeal Swab     Status: None   Collection Time: 06/26/19  1:41 PM   Specimen: Nasopharyngeal Swab  Result Value Ref Range Status   SARS Coronavirus 2 NEGATIVE NEGATIVE Final    Comment: (NOTE) SARS-CoV-2 target nucleic acids are NOT DETECTED. The SARS-CoV-2 RNA is generally detectable in upper and lower respiratory specimens during the acute phase of infection. Negative results do not preclude SARS-CoV-2 infection, do not rule out co-infections with other pathogens, and should not be used as the sole basis for treatment or other patient management decisions. Negative results must be combined with clinical observations, patient history, and epidemiological information. The expected result is Negative. Fact Sheet for Patients: HairSlick.no Fact Sheet for Healthcare Providers: quierodirigir.com This test is not yet approved or cleared by the [place] FDA and  has been authorized for detection and/or diagnosis of SARS-CoV-2 by FDA under an Emergency Use  Authorization (EUA). This EUA will remain  in effect (meaning this test can be used) for the duration of the COVID-19 declaration under Section 56 4(b)(1) of the Act, 21 U.S.C. section 360bbb-3(b)(1), unless the authorization is terminated or revoked sooner. Performed at [facility], [address], [place], [place] [address]   Respiratory Panel by RT PCR (Flu A&B, Covid) - Nasopharyngeal Swab     Status: None   Collection Time: 07/02/19  3:27 PM   Specimen: Nasopharyngeal Swab  Result Value Ref Range Status   SARS Coronavirus 2 by RT PCR NEGATIVE NEGATIVE Final    Comment: (NOTE) SARS-CoV-2 target nucleic acids are NOT DETECTED. The SARS-CoV-2 RNA is generally detectable in upper respiratoy specimens during the acute phase of infection. The lowest concentration of SARS-CoV-2 viral copies this assay can detect is 131 copies/mL. A negative result does not preclude SARS-Cov-2 infection and should not be used as the sole basis for treatment or other patient management decisions. A negative result may occur with  improper specimen collection/handling, submission of specimen other than nasopharyngeal swab, presence of viral mutation(s) within the areas targeted by this assay, and inadequate number of viral copies (<131 copies/mL). A negative result must be combined with clinical observations, patient history, and epidemiological information. The expected result is Negative. Fact Sheet for Patients:  https://www.moore.com/ Fact Sheet for Healthcare Providers:  https://www.young.biz/ This test is not yet ap proved or cleared by the [place] FDA and  has been authorized for detection and/or diagnosis of SARS-CoV-2 by FDA under an Emergency Use Authorization (EUA). This EUA will remain  in effect (meaning this test can be used) for the duration of the COVID-19 declaration under Section 564(b)(1) of the Act, 21 U.S.C. section 360bbb-3(b)(1),  unless the authorization is terminated or revoked sooner.    Influenza A by PCR NEGATIVE NEGATIVE Final   Influenza B by PCR NEGATIVE NEGATIVE Final    Comment: (NOTE) The Xpert Xpress SARS-CoV-2/FLU/RSV assay is intended as an aid in  the diagnosis of influenza from Nasopharyngeal swab specimens and  should not be used as a sole basis for treatment. Nasal washings and  aspirates are unacceptable for Xpert Xpress SARS-CoV-2/FLU/RSV  testing. Fact Sheet for Patients: https://www.moore.com/ Fact Sheet for Healthcare Providers: https://www.young.biz/ This test is not yet approved or cleared by the [place] FDA and  has been authorized for detection and/or diagnosis of SARS-CoV-2 by  FDA under an Emergency Use Authorization (EUA). This EUA will remain  in effect (meaning this test can be used) for the duration of the  Covid-19  declaration under Section 564(b)(1) of the Act, 21  U.S.C. section 360bbb-3(b)(1), unless the authorization is  terminated or revoked. Performed at [facility], [place] [address], [place], [place] [address]      Radiological Exams on Admission: DG Chest Portable 1 View  Result Date: 07/02/2019 CLINICAL DATA:  Cough, shortness of breath, hypoxia EXAM: PORTABLE CHEST 1 VIEW COMPARISON:  06/26/2019 FINDINGS: The heart size and mediastinal contours are within normal limits. Both lungs are clear. The visualized skeletal structures are unremarkable. IMPRESSION: No acute abnormality of the lungs in AP portable projection. Electronically Signed   By: Eddie Candle M.D.   On: 07/02/2019 15:57    EKG: Independently reviewed.  It shows sinus tachycardia no significant changes  Assessment/Plan Principal Problem:   Acute on chronic respiratory failure with hypoxia (HCC) Active Problems:   Tobacco abuse   COPD exacerbation (HCC)     #1 acute respiratory failure with hypoxia: Suspected COPD exacerbation.  Patient will be  admitted for observation.  IV steroid antibiotics and breathing treatments.  He has been Covid negative before and antigen test today.  #2 history of COPD: Treat as above.  #3 tobacco abuse: Tobacco cessation counseling given.  Nicotine patch.     DVT prophylaxis: Lovenox Code Status: Full code Family Communication: No family at bedside Disposition Plan: Home Consults called: None Admission status: Observation  Severity of Illness: The appropriate patient status for this patient is OBSERVATION. Observation status is judged to be reasonable and necessary in order to provide the required intensity of service to ensure the patient's safety. The patient's presenting symptoms, physical exam findings, and initial radiographic and laboratory data in the context of their medical condition is felt to place them at decreased risk for further clinical deterioration. Furthermore, it is anticipated that the patient will be medically stable for discharge from the hospital within 2 midnights of admission. The following factors support the patient status of observation.   " The patient's presenting symptoms include shortness of breath. " The physical exam findings include marked expiratory wheezing. " The initial radiographic and laboratory data are no significant findings.     Barbette Merino MD [facility] Pager [phone]  If 7PM-7AM, please contact night-coverage [email or web address] Password [facility]  07/02/2019, 11:51 PM

## 2019-07-02 NOTE — ED Provider Notes (Signed)
[facility] EMERGENCY DEPARTMENT Provider Note   CSN: [id number] Arrival date & time: 07/02/19  1446     History Chief Complaint  Patient presents with   Shortness of Breath    Jesse Walters is a 49 y.o. male.  The history is provided by the patient and medical records. No language interpreter was used.  Shortness of Breath Severity:  Severe Onset quality:  Gradual Duration:  3 weeks Timing:  Constant Progression:  Waxing and waning Chronicity:  Recurrent Relieved by:  Nothing Worsened by:  Nothing Ineffective treatments:  None tried Associated symptoms: chest pain, cough, sputum production and wheezing   Associated symptoms: no abdominal pain, no diaphoresis, no fever, no headaches and no vomiting        Past Medical History:  Diagnosis Date   Seasonal allergic rhinitis     Patient Active Problem List   Diagnosis Date Noted   Pain, dental 10/25/2013    Past Surgical History:  Procedure Laterality Date   HERNIA REPAIR         Family History  Problem Relation Age of Onset   Cancer Mother     Social History   Tobacco Use   Smoking status: Current Every Day Smoker    Types: Cigarettes   Smokeless tobacco: Never Used   Tobacco comment: marijuana  Substance Use Topics   Alcohol use: Yes    Alcohol/week: 7.0 standard drinks    Types: 7 Cans of beer per week   Drug use: Not Currently    Types: Marijuana    Comment: 2 weeks ago last smoked    Home Medications Prior to Admission medications   Medication Sig Start Date End Date Taking? Authorizing Provider  albuterol (VENTOLIN HFA) 108 (90 Base) MCG/ACT inhaler Inhale 1-2 puffs into the lungs every 6 (six) hours as needed for wheezing or shortness of breath. 06/18/19   Maxwell Caul, PA-C  albuterol (VENTOLIN HFA) 108 (90 Base) MCG/ACT inhaler Inhale 2 puffs into the lungs every 6 (six) hours as needed for wheezing or shortness of breath. 06/26/19   Mannie Stabile,  PA-C  amLODipine (NORVASC) 5 MG tablet Take 1 tablet (5 mg total) by mouth daily. 05/27/19   Wallis Bamberg, PA-C  benzonatate (TESSALON) 100 MG capsule Take 1-2 capsules (100-200 mg total) by mouth 3 (three) times daily as needed. Patient not taking: Reported on 06/26/2019 05/27/19   Wallis Bamberg, PA-C  promethazine-dextromethorphan (PROMETHAZINE-DM) 6.25-15 MG/5ML syrup Take 5 mLs by mouth at bedtime as needed for cough. Patient not taking: Reported on 06/26/2019 05/27/19   Wallis Bamberg, PA-C    Allergies    Dilaudid [hydromorphone hcl]  Review of Systems   Review of Systems  Constitutional: Positive for fatigue. Negative for chills, diaphoresis and fever.  HENT: Negative for congestion.   Respiratory: Positive for cough, sputum production, chest tightness, shortness of breath and wheezing. Negative for stridor.   Cardiovascular: Positive for chest pain. Negative for palpitations and leg swelling.  Gastrointestinal: Negative for abdominal pain, constipation, diarrhea, nausea and vomiting.  Genitourinary: Negative for frequency.  Musculoskeletal: Negative for back pain.  Neurological: Negative for numbness and headaches.  All other systems reviewed and are negative.   Physical Exam Updated Vital Signs BP (!) 153/77 (BP Location: Right Arm)    Pulse 86    Temp 98.7 F (37.1 C) (Oral)    Resp (!) 22    Ht 5\' 10"  (1.778 m)    Wt 71.7 kg  SpO2 100%    BMI 22.67 kg/m   Physical Exam Vitals and nursing note reviewed.  Constitutional:      General: He is not in acute distress.    Appearance: He is well-developed. He is not ill-appearing, toxic-appearing or diaphoretic.  HENT:     Head: Normocephalic and atraumatic.     Mouth/Throat:     Pharynx: No oropharyngeal exudate.  Eyes:     Conjunctiva/sclera: Conjunctivae normal.     Pupils: Pupils are equal, round, and reactive to light.  Cardiovascular:     Rate and Rhythm: Regular rhythm. Tachycardia present.     Heart sounds: No murmur.    Pulmonary:     Effort: Tachypnea present. No respiratory distress.     Breath sounds: No stridor. Wheezing and rhonchi present. No decreased breath sounds or rales.  Chest:     Chest wall: No tenderness.  Abdominal:     Palpations: Abdomen is soft.     Tenderness: There is no abdominal tenderness.  Musculoskeletal:     Cervical back: Neck supple.     Right lower leg: No tenderness. No edema.     Left lower leg: No tenderness. No edema.  Skin:    General: Skin is warm and dry.     Capillary Refill: Capillary refill takes less than 2 seconds.  Neurological:     General: No focal deficit present.     Mental Status: He is alert.  Psychiatric:        Mood and Affect: Mood normal.     ED Results / Procedures / Treatments   Labs (all labs ordered are listed, but only abnormal results are displayed) Labs Reviewed  COMPREHENSIVE METABOLIC PANEL - Abnormal; Notable for the following components:      Result Value   Glucose, Bld 102 (*)    Calcium 8.6 (*)    Total Protein 5.9 (*)    Albumin 3.4 (*)    All other components within normal limits  RESPIRATORY PANEL BY RT PCR (FLU A&B, COVID)  CBC WITH DIFFERENTIAL/PLATELET  D-DIMER, QUANTITATIVE (NOT AT [facility])  LIPASE, BLOOD  LACTIC ACID, PLASMA  LACTIC ACID, PLASMA  TROPONIN I (HIGH SENSITIVITY)  TROPONIN I (HIGH SENSITIVITY)    EKG EKG Interpretation  Date/Time:  Friday July 02 2019 14:55:47 EST Ventricular Rate:  67 PR Interval:    QRS Duration: 88 QT Interval:  401 QTC Calculation: 424 R Axis:   81 Text Interpretation: Age not entered, assumed to be  49 years old for purpose of ECG interpretation Sinus rhythm Left ventricular hypertrophy Nonspecific T abnormalities, lateral leads When comapred to prior, slower rate with similar sharp t waves.  more wandering baseline. No STEMI Confirmed by Theda Belfast ([id number]) on 07/02/2019 5:12:20 PM   Radiology DG Chest Portable 1 View  Result Date: 07/02/2019 CLINICAL DATA:   Cough, shortness of breath, hypoxia EXAM: PORTABLE CHEST 1 VIEW COMPARISON:  06/26/2019 FINDINGS: The heart size and mediastinal contours are within normal limits. Both lungs are clear. The visualized skeletal structures are unremarkable. IMPRESSION: No acute abnormality of the lungs in AP portable projection. Electronically Signed   By: Lauralyn Primes M.D.   On: 07/02/2019 15:57    Procedures Procedures (including critical care time)  CRITICAL CARE Performed by: Canary Brim Deaven Barron Total critical care time: 35 minutes Critical care time was exclusive of separately billable procedures and treating other patients. Critical care was necessary to treat or prevent imminent or life-threatening deterioration. Critical care was time  spent personally by me on the following activities: development of treatment plan with patient and/or surrogate as well as nursing, discussions with consultants, evaluation of patient's response to treatment, examination of patient, obtaining history from patient or surrogate, ordering and performing treatments and interventions, ordering and review of laboratory studies, ordering and review of radiographic studies, pulse oximetry and re-evaluation of patient's condition.  Medications Ordered in ED Medications  albuterol (VENTOLIN HFA) 108 (90 Base) MCG/ACT inhaler 2 puff (has no administration in time range)  albuterol (VENTOLIN HFA) 108 (90 Base) MCG/ACT inhaler 2 puff (2 puffs Inhalation Given 07/02/19 1546)  ipratropium (ATROVENT HFA) inhaler 2 puff (2 puffs Inhalation Given 07/02/19 1547)    ED Course  I have reviewed the triage vital signs and the nursing notes.  Pertinent labs & imaging results that were available during my care of the patient were reviewed by me and considered in my medical decision making (see chart for details).    MDM Rules/Calculators/A&P                      Jesse Walters is a 49 y.o. male with a past medical history significant for  tobacco abuse and recent diagnosis of COPD who presents with respiratory distress.  According to EMS report and nursing, patient was in respiratory distress with significant wheezing with tripoding and increased work of breathing.  EMS gave the patient IM epinephrine, Solu-Medrol, magnesium, and albuterol to improve his breathing.  He now arrives on 2 L nasal cannula oxygen supplementation to maintain oxygen's in the 90s.  He reports that he has had multiple visits over the last few weeks for breathing problems and until this past month had never been diagnosed with COPD.  He does not use oxygen at home.  He tried to use his inhaler without significant relief.  He reports that his left chest pain has been worsening for the last few days and is worse with coughing and deep breathing.  He reports he has had no fevers or chills but has had a productive cough with white sputum.  He denies leg pain or leg swelling and denies any history of DVT or PE.  He denies any trauma.  He denies any nausea or vomiting aside from when he is having a coughing fit.  He denies any urinary or other GI symptoms.  He denies any headaches.  Denies any other complaints.  He describes his chest pain as 6 out of 10 in severity its worst and currently a 4-10 now that he is breathing better.  On initial exam, patient still has some wheezing despite the Solu-Medrol, epinephrine, mag, and albuterol.  He did not have crackles and had some faint rhonchi.  Chest and abdomen were nontender however he did have some tenderness in the left lateral chest where his discomfort was.  No leg tenderness or leg swelling.  Good pulses in all extremities.  Abdomen nontender.  Patient still wheezing but now resting more comfortably.  EKG shows no STEMI but does show some sharp T waves.  Based on the patient's respiratory distress and the respiratory failure at home before multiple medications of intervention, I anticipate patient will require admission for  further respiratory monitoring after his work-up is complete in the emergency department.  Given his pleuritic pain and tachypnea and respiratory distress, we will add a D-dimer, troponin, and other labs.  Will get chest x-ray.  We will recheck for Covid.  Anticipate admission after work-up is completed.  11:24 PM After many hours of observation, patient continued to have recurrent wheezing and when ambulated became tachycardic, tachypneic, and his oxygen hovered in the low 90s.  He was very short of breath and he tells me he does not feel safe going home as he is still very short of breath.  His work-up was otherwise reassuring with reassuring chest x-ray, labs, Covid test negative, and troponin negative x2.  D-dimer was negative.  Lactic acid normal.  Do not feel there is an infectious etiology at this time and although he has a new diagnosis COPD, I anticipate this being related to his reactive airway disease.  Due to the patient's needing steroids, magnesium, epi, and inhalers, with still having wheezing and shortness of breath with any exertion, he will be admitted likely under observation overnight for further pulmonary monitoring and breathing treatments.    Final Clinical Impression(s) / ED Diagnoses Final diagnoses:  SOB (shortness of breath)  Tachycardia  Chest pain, unspecified type  COPD exacerbation (HCC)     Clinical Impression: 1. SOB (shortness of breath)   2. Tachycardia   3. Chest pain, unspecified type   4. COPD exacerbation (HCC)     Disposition: Admit  This note was prepared with assistance of Dragon voice recognition software. Occasional wrong-word or sound-a-like substitutions may have occurred due to the inherent limitations of voice recognition software.     Jesse Walters, Canary Brim, MD 07/03/19 [phone]

## 2019-07-02 NOTE — ED Notes (Signed)
Sitting up in bed, pt O2sat was 91. Pt ambulated through hall, twice around nurse's station. O2sat ranged from 91 to 93 throughout entire hall ambulation, and ranged from 91 to 95 once laying back in room. After ambulation, pt also exhibited wheezing and coughing fit. Stated that he physically felt better walking around than laying down.

## 2019-07-03 LAB — CBC
HCT: 40.4 % (ref 39.0–52.0)
Hemoglobin: 13.6 g/dL (ref 13.0–17.0)
MCH: 30 pg (ref 26.0–34.0)
MCHC: 33.7 g/dL (ref 30.0–36.0)
MCV: 89 fL (ref 80.0–100.0)
Platelets: 339 10*3/uL (ref 150–400)
RBC: 4.54 MIL/uL (ref 4.22–5.81)
RDW: 13 % (ref 11.5–15.5)
WBC: 8.7 10*3/uL (ref 4.0–10.5)
nRBC: 0 % (ref 0.0–0.2)

## 2019-07-03 LAB — COMPREHENSIVE METABOLIC PANEL
ALT: 18 U/L (ref 0–44)
AST: 16 U/L (ref 15–41)
Albumin: 3.2 g/dL — ABNORMAL LOW (ref 3.5–5.0)
Alkaline Phosphatase: 58 U/L (ref 38–126)
Anion gap: 8 (ref 5–15)
BUN: 13 mg/dL (ref 6–20)
CO2: 20 mmol/L — ABNORMAL LOW (ref 22–32)
Calcium: 8.8 mg/dL — ABNORMAL LOW (ref 8.9–10.3)
Chloride: 107 mmol/L (ref 98–111)
Creatinine, Ser: 0.77 mg/dL (ref 0.61–1.24)
GFR calc Af Amer: >60 mL/min (ref >60)
GFR calc non Af Amer: >60 mL/min (ref >60)
Glucose, Bld: 137 mg/dL — ABNORMAL HIGH (ref 70–99)
Potassium: 4.1 mmol/L (ref 3.5–5.1)
Sodium: 135 mmol/L (ref 135–145)
Total Bilirubin: 1.3 mg/dL — ABNORMAL HIGH (ref 0.3–1.2)
Total Protein: 6 g/dL — ABNORMAL LOW (ref 6.5–8.1)

## 2019-07-03 LAB — HIV ANTIBODY (ROUTINE TESTING W REFLEX): HIV Screen 4th Generation wRfx: NONREACTIVE

## 2019-07-03 MED ORDER — ONDANSETRON HCL 4 MG PO TABS: 4.0000 mg | ORAL_TABLET | Freq: Four times a day (QID) | ORAL | Status: DC | PRN

## 2019-07-03 MED ORDER — SODIUM CHLORIDE 0.9 % IV SOLN: INTRAVENOUS | Status: DC

## 2019-07-03 MED ORDER — ALBUTEROL SULFATE (2.5 MG/3ML) 0.083% IN NEBU: 2.5000 mg | INHALATION_SOLUTION | RESPIRATORY_TRACT | Status: DC | PRN

## 2019-07-03 MED ORDER — ACETAMINOPHEN 325 MG PO TABS: 650.0000 mg | ORAL_TABLET | Freq: Four times a day (QID) | ORAL | Status: DC | PRN

## 2019-07-03 MED ORDER — ENOXAPARIN SODIUM 40 MG/0.4ML ~~LOC~~ SOLN
40.0000 mg | Freq: Every day | SUBCUTANEOUS | Status: DC
  Administered 2019-07-03 – 2019-07-04 (×2): 40 mg via SUBCUTANEOUS
  Filled 2019-07-03 (×2): qty 0.4

## 2019-07-03 MED ORDER — IPRATROPIUM-ALBUTEROL 0.5-2.5 (3) MG/3ML IN SOLN
3.0000 mL | Freq: Four times a day (QID) | RESPIRATORY_TRACT | Status: DC
  Administered 2019-07-04 (×2): 3 mL via RESPIRATORY_TRACT
  Filled 2019-07-03 (×2): qty 3

## 2019-07-03 MED ORDER — ONDANSETRON HCL 4 MG/2ML IJ SOLN: 4.0000 mg | Freq: Four times a day (QID) | INTRAMUSCULAR | Status: DC | PRN

## 2019-07-03 MED ORDER — METHYLPREDNISOLONE SODIUM SUCC 125 MG IJ SOLR
125.0000 mg | Freq: Four times a day (QID) | INTRAMUSCULAR | Status: DC
  Administered 2019-07-03 (×2): 125 mg via INTRAVENOUS
  Filled 2019-07-03 (×2): qty 2

## 2019-07-03 MED ORDER — ACETAMINOPHEN 650 MG RE SUPP: 650.0000 mg | Freq: Four times a day (QID) | RECTAL | Status: DC | PRN

## 2019-07-03 MED ORDER — IPRATROPIUM-ALBUTEROL 0.5-2.5 (3) MG/3ML IN SOLN
3.0000 mL | RESPIRATORY_TRACT | Status: DC
  Administered 2019-07-03 (×2): 3 mL via RESPIRATORY_TRACT
  Filled 2019-07-03 (×2): qty 3

## 2019-07-03 MED ORDER — METHYLPREDNISOLONE SODIUM SUCC 125 MG IJ SOLR
60.0000 mg | Freq: Four times a day (QID) | INTRAMUSCULAR | Status: DC
  Administered 2019-07-03 – 2019-07-05 (×8): 60 mg via INTRAVENOUS
  Filled 2019-07-03 (×8): qty 2

## 2019-07-03 NOTE — ED Notes (Signed)
Breakfast at bedside.

## 2019-07-03 NOTE — Progress Notes (Addendum)
Pt arrived on unit Oriented to room, all belongings in reach including call light Pt states he left his android cell phone downstairs  RN called ED RN to verify, ED RN looked in the room and no cell phone was found but stated she will continue to look Pt denies falling in last six months, pt denies pain  Vital signs documented and telemetry verified with CCMD  Assisted pt in ordering lunch  Will continue to monitor

## 2019-07-03 NOTE — ED Notes (Signed)
Breakfast ordered 

## 2019-07-03 NOTE — Progress Notes (Signed)
Pt android cell phone found and returned to him  Pt has all belongings at this time  Will continue to monitor

## 2019-07-03 NOTE — ED Notes (Signed)
Pt's sats noted to be 87-90% RA; pt placed on 2L; Dr. Benjamine Mola notifed

## 2019-07-03 NOTE — Progress Notes (Signed)
Progress Note    Jesse Walters  LNL:[id number] DOB: [birth year]  DOA: 07/02/2019 PCP: Patient, No Pcp Per    Brief Narrative:    Medical records reviewed and are as summarized below:  Jesse Walters is an 49 y.o. male with medical history significant of seasonal allergies and tobacco abuse who presented with sudden onset of shortness of breath and cough.  Patient went to the local fire department where he was initiated on breathing treatment when he was found to be hypoxic and wheezing excessively.  He was seen at [facility] a week ago with shortness of breath.  He reported history of COPD but not able to take any of his medications at the time.  Patient was seen evaluated and treated.  He was discharged home with breathing treatments.  Today however he came to the ER where he has been excessively wheezing.  Treatment with steroids and breathing treatments was only minimal relief.  He was having oxygen saturation in the 80s.    Assessment/Plan:   Principal Problem:   Acute on chronic respiratory failure with hypoxia (HCC) Active Problems:   Tobacco abuse   COPD exacerbation (HCC)   acute respiratory failure with hypoxia due to suspected COPD exacerbation. -IV steroid antibiotics and breathing treatments -Per Dr. Jonelle Sidle sats were in the 80s upon arrival -D-dimer normal doubt PE  tobacco abuse:  -Patient states he quit 2 weeks ago  Dr. Jonelle Sidle has also consulted the [facility] team for help with patient's medications as he states he cannot afford them    Family Communication/Anticipated D/C date and plan/Code Status   DVT prophylaxis: Lovenox ordered. Code Status: Full Code.  Family Communication:  Disposition Plan:    Medical Consultants:    None.     Subjective:   States he quit smoking about 2 weeks ago Still feels very winded  Objective:    Vitals:   07/03/19 0545 07/03/19 0900 07/03/19 0910 07/03/19 1058  BP: 109/68 115/81  124/70  Pulse: 82 83 72 92   Resp: (!) 23 (!) 22 (!) 22 18  Temp:      TempSrc:      SpO2: 91% (!) 89% 92% 92%  Weight:      Height:       No intake or output data in the 24 hours ending 07/03/19 1107 Filed Weights   07/02/19 1508  Weight: 71.7 kg    Exam: In bed Mild increase of respiratory effort, diffuse wheezing Tachycardic No lower extremity edema Alert and oriented x3  Data Reviewed:   I have personally reviewed following labs and imaging studies:  Labs: Labs show the following:   Basic Metabolic Panel: Recent Labs  Lab 06/26/19 1341 06/26/19 1706 07/02/19 1543 07/03/19 0320  NA 138  --  139 135  K 5.6*   < > 4.0 4.1  CL 107  --  107 107  CO2 26  --  22 20*  GLUCOSE 101*  --  102* 137*  BUN 11  --  11 13  CREATININE 0.76  --  0.82 0.77  CALCIUM 8.7*  --  8.6* 8.8*   < > = values in this interval not displayed.   GFR Estimated Creatinine Clearance: 112 mL/min (by C-G formula based on SCr of 0.77 mg/dL). Liver Function Tests: Recent Labs  Lab 07/02/19 1543 07/03/19 0320  AST 17 16  ALT 17 18  ALKPHOS 62 58  BILITOT 0.8 1.3*  PROT 5.9* 6.0*  ALBUMIN 3.4* 3.2*  Recent Labs  Lab 07/02/19 1543  LIPASE 16   No results for input(s): AMMONIA in the last 168 hours. Coagulation profile No results for input(s): INR, PROTIME in the last 168 hours.  CBC: Recent Labs  Lab 06/26/19 1341 07/02/19 1543 07/03/19 0320  WBC 7.3 8.1 8.7  NEUTROABS 4.9 6.3  --   HGB 15.0 13.7 13.6  HCT 44.4 41.7 40.4  MCV 89.7 91.6 89.0  PLT 293 354 339   Cardiac Enzymes: No results for input(s): CKTOTAL, CKMB, CKMBINDEX, TROPONINI in the last 168 hours. BNP (last 3 results) No results for input(s): PROBNP in the last 8760 hours. CBG: No results for input(s): GLUCAP in the last 168 hours. D-Dimer: Recent Labs    07/02/19 1543  DDIMER 0.37   Hgb A1c: No results for input(s): HGBA1C in the last 72 hours. Lipid Profile: No results for input(s): CHOL, HDL, LDLCALC, TRIG, CHOLHDL,  LDLDIRECT in the last 72 hours. Thyroid function studies: No results for input(s): TSH, T4TOTAL, T3FREE, THYROIDAB in the last 72 hours.  Invalid input(s): FREET3 Anemia work up: No results for input(s): VITAMINB12, FOLATE, FERRITIN, TIBC, IRON, RETICCTPCT in the last 72 hours. Sepsis Labs: Recent Labs  Lab 06/26/19 1341 07/02/19 1543 07/02/19 1544 07/03/19 0320  WBC 7.3 8.1  --  8.7  LATICACIDVEN  --   --  1.5  --     Microbiology Recent Results (from the past 240 hour(s))  SARS CORONAVIRUS 2 (TAT 6-24 HRS) Nasopharyngeal Nasopharyngeal Swab     Status: None   Collection Time: 06/26/19  1:41 PM   Specimen: Nasopharyngeal Swab  Result Value Ref Range Status   SARS Coronavirus 2 NEGATIVE NEGATIVE Final    Comment: (NOTE) SARS-CoV-2 target nucleic acids are NOT DETECTED. The SARS-CoV-2 RNA is generally detectable in upper and lower respiratory specimens during the acute phase of infection. Negative results do not preclude SARS-CoV-2 infection, do not rule out co-infections with other pathogens, and should not be used as the sole basis for treatment or other patient management decisions. Negative results must be combined with clinical observations, patient history, and epidemiological information. The expected result is Negative. Fact Sheet for Patients: HairSlick.no Fact Sheet for Healthcare Providers: quierodirigir.com This test is not yet approved or cleared by the [place] FDA and  has been authorized for detection and/or diagnosis of SARS-CoV-2 by FDA under an Emergency Use Authorization (EUA). This EUA will remain  in effect (meaning this test can be used) for the duration of the COVID-19 declaration under Section 56 4(b)(1) of the Act, 21 U.S.C. section 360bbb-3(b)(1), unless the authorization is terminated or revoked sooner. Performed at [facility], [address] [place], [place] [address]     Respiratory Panel by RT PCR (Flu A&B, Covid) - Nasopharyngeal Swab     Status: None   Collection Time: 07/02/19  3:27 PM   Specimen: Nasopharyngeal Swab  Result Value Ref Range Status   SARS Coronavirus 2 by RT PCR NEGATIVE NEGATIVE Final    Comment: (NOTE) SARS-CoV-2 target nucleic acids are NOT DETECTED. The SARS-CoV-2 RNA is generally detectable in upper respiratoy specimens during the acute phase of infection. The lowest concentration of SARS-CoV-2 viral copies this assay can detect is 131 copies/mL. A negative result does not preclude SARS-Cov-2 infection and should not be used as the sole basis for treatment or other patient management decisions. A negative result may occur with  improper specimen collection/handling, submission of specimen other than nasopharyngeal swab, presence of viral  mutation(s) within the areas targeted by this assay, and inadequate number of viral copies (<131 copies/mL). A negative result must be combined with clinical observations, patient history, and epidemiological information. The expected result is Negative. Fact Sheet for Patients:  https://www.moore.com/ Fact Sheet for Healthcare Providers:  https://www.young.biz/ This test is not yet ap proved or cleared by the [place] FDA and  has been authorized for detection and/or diagnosis of SARS-CoV-2 by FDA under an Emergency Use Authorization (EUA). This EUA will remain  in effect (meaning this test can be used) for the duration of the COVID-19 declaration under Section 564(b)(1) of the Act, 21 U.S.C. section 360bbb-3(b)(1), unless the authorization is terminated or revoked sooner.    Influenza A by PCR NEGATIVE NEGATIVE Final   Influenza B by PCR NEGATIVE NEGATIVE Final    Comment: (NOTE) The Xpert Xpress SARS-CoV-2/FLU/RSV assay is intended as an aid in  the diagnosis of influenza from Nasopharyngeal swab specimens and  should not be used as a sole  basis for treatment. Nasal washings and  aspirates are unacceptable for Xpert Xpress SARS-CoV-2/FLU/RSV  testing. Fact Sheet for Patients: https://www.moore.com/ Fact Sheet for Healthcare Providers: https://www.young.biz/ This test is not yet approved or cleared by the [place] FDA and  has been authorized for detection and/or diagnosis of SARS-CoV-2 by  FDA under an Emergency Use Authorization (EUA). This EUA will remain  in effect (meaning this test can be used) for the duration of the  Covid-19 declaration under Section 564(b)(1) of the Act, 21  U.S.C. section 360bbb-3(b)(1), unless the authorization is  terminated or revoked. Performed at [facility], [address] [place], [place] [address]     Procedures and diagnostic studies:  DG Chest Portable 1 View  Result Date: 07/02/2019 CLINICAL DATA:  Cough, shortness of breath, hypoxia EXAM: PORTABLE CHEST 1 VIEW COMPARISON:  06/26/2019 FINDINGS: The heart size and mediastinal contours are within normal limits. Both lungs are clear. The visualized skeletal structures are unremarkable. IMPRESSION: No acute abnormality of the lungs in AP portable projection. Electronically Signed   By: Lauralyn Primes M.D.   On: 07/02/2019 15:57    Medications:   . enoxaparin (LOVENOX) injection  40 mg Subcutaneous Daily  . methylPREDNISolone (SOLU-MEDROL) injection  125 mg Intravenous Q6H  . nicotine  21 mg Transdermal Daily   Continuous Infusions: . sodium chloride 100 mL/hr at 07/03/19 0103  . levofloxacin (LEVAQUIN) IV Stopped (07/03/19 0234)     LOS: 0 days   Joseph Art  [facility]   How to contact the [facility] Attending or Consulting provider 7A - 7P or covering provider during after hours 7P -7A, for this patient?  1. Check the care team in [facility] and look for a) attending/consulting [facility] provider listed and b) the [facility] team listed 2. Log into [email or web address] and use [facility]'s  universal password to access. If you do not have the password, please contact the hospital operator. 3. Locate the [facility] provider you are looking for under [facility] and page to a number that you can be directly reached. 4. If you still have difficulty reaching the provider, please page the [facility] (Director on Call) for the Hospitalists listed on amion for assistance.  07/03/2019, 11:07 AM

## 2019-07-04 MED ORDER — IPRATROPIUM-ALBUTEROL 0.5-2.5 (3) MG/3ML IN SOLN
3.0000 mL | Freq: Three times a day (TID) | RESPIRATORY_TRACT | Status: DC
  Administered 2019-07-04 – 2019-07-05 (×2): 3 mL via RESPIRATORY_TRACT
  Filled 2019-07-04 (×2): qty 3

## 2019-07-04 MED ORDER — GUAIFENESIN 100 MG/5ML PO SOLN: 5.0000 mL | ORAL | Status: DC | PRN

## 2019-07-04 MED ORDER — DOXYCYCLINE HYCLATE 100 MG PO TABS
100.0000 mg | ORAL_TABLET | Freq: Two times a day (BID) | ORAL | Status: DC
  Administered 2019-07-04 – 2019-07-05 (×2): 100 mg via ORAL
  Filled 2019-07-04 (×2): qty 1

## 2019-07-04 MED ORDER — DICLOFENAC SODIUM 1 % EX GEL
2.0000 g | Freq: Four times a day (QID) | CUTANEOUS | Status: DC
  Administered 2019-07-04 – 2019-07-05 (×5): 2 g via TOPICAL
  Filled 2019-07-04: qty 100

## 2019-07-04 NOTE — Progress Notes (Signed)
Progress Note    Jesse Walters  WUJ:[id number] DOB: Jan 28, 1971  DOA: 07/02/2019 PCP: Patient, No Pcp Per    Brief Narrative:    Medical records reviewed and are as summarized below:  Jesse Walters is an 49 y.o. male with medical history significant of seasonal allergies and tobacco abuse who presented with sudden onset of shortness of breath and cough.  Patient went to the local fire department where he was initiated on breathing treatment when he was found to be hypoxic and wheezing excessively.  He was seen at [facility] a week ago with shortness of breath.  He reported history of COPD but not able to take any of his medications at the time.  Patient was seen evaluated and treated.  He was discharged home with breathing treatments.  Today however he came to the ER where he has been excessively wheezing.  Treatment with steroids and breathing treatments was only minimal relief.  He was having oxygen saturation in the 80s.    Assessment/Plan:   Principal Problem:   Acute on chronic respiratory failure with hypoxia (HCC) Active Problems:   Tobacco abuse   COPD exacerbation (HCC)   acute respiratory failure with hypoxia due to suspected COPD exacerbation. -IV steroid antibiotics and breathing treatments -Per Dr. Mikeal Hawthorne sats were in the 80s upon arrival -D-dimer normal doubt PE -mucinex -voltaren gel for side pain from coughing -wean O2 to off if able  tobacco abuse:  -Patient states he quit 2 weeks ago  Dr. Mikeal Hawthorne has also consulted the [facility] team for help with patient's medications as he states he cannot afford them    Family Communication/Anticipated D/C date and plan/Code Status   DVT prophylaxis: Lovenox ordered. Code Status: Full Code.  Family Communication:  Disposition Plan: Patient needs continued IV steroids and weaning of O2 as he is not on oxygen at home, he also needs a consult from [facility] to help with PCP and medications as he cannot afford them   Medical  Consultants:    None.     Subjective:   Complaining of side pain from coughing  Objective:    Vitals:   07/04/19 0545 07/04/19 0829 07/04/19 0839 07/04/19 1144  BP:  117/72    Pulse:  97 86 87  Resp:  18 18 18   Temp:  97.9 F (36.6 C)    TempSrc:  Oral    SpO2: 90% 96% 92% 96%  Weight:      Height:        Intake/Output Summary (Last 24 hours) at 07/04/2019 1205 Last data filed at 07/04/2019 0715 Gross per 24 hour  Intake 1350 ml  Output 400 ml  Net 950 ml   Filed Weights   07/02/19 1508  Weight: 71.7 kg    Exam: In bed, appears more comfortable Moving more air with no wheezing and does not appear to have increased work of breathing at rest Regular rate and rhythm Positive bowel sounds, soft nontender Alert and orient x3  Data Reviewed:   I have personally reviewed following labs and imaging studies:  Labs: Labs show the following:   Basic Metabolic Panel: Recent Labs  Lab 07/02/19 1543 07/03/19 0320  NA 139 135  K 4.0 4.1  CL 107 107  CO2 22 20*  GLUCOSE 102* 137*  BUN 11 13  CREATININE 0.82 0.77  CALCIUM 8.6* 8.8*   GFR Estimated Creatinine Clearance: 112 mL/min (by C-G formula based on SCr of 0.77 mg/dL). Liver Function Tests:  Recent Labs  Lab 07/02/19 1543 07/03/19 0320  AST 17 16  ALT 17 18  ALKPHOS 62 58  BILITOT 0.8 1.3*  PROT 5.9* 6.0*  ALBUMIN 3.4* 3.2*   Recent Labs  Lab 07/02/19 1543  LIPASE 16   No results for input(s): AMMONIA in the last 168 hours. Coagulation profile No results for input(s): INR, PROTIME in the last 168 hours.  CBC: Recent Labs  Lab 07/02/19 1543 07/03/19 0320  WBC 8.1 8.7  NEUTROABS 6.3  --   HGB 13.7 13.6  HCT 41.7 40.4  MCV 91.6 89.0  PLT 354 339   Cardiac Enzymes: No results for input(s): CKTOTAL, CKMB, CKMBINDEX, TROPONINI in the last 168 hours. BNP (last 3 results) No results for input(s): PROBNP in the last 8760 hours. CBG: No results for input(s): GLUCAP in the last 168  hours. D-Dimer: Recent Labs    07/02/19 1543  DDIMER 0.37   Hgb A1c: No results for input(s): HGBA1C in the last 72 hours. Lipid Profile: No results for input(s): CHOL, HDL, LDLCALC, TRIG, CHOLHDL, LDLDIRECT in the last 72 hours. Thyroid function studies: No results for input(s): TSH, T4TOTAL, T3FREE, THYROIDAB in the last 72 hours.  Invalid input(s): FREET3 Anemia work up: No results for input(s): VITAMINB12, FOLATE, FERRITIN, TIBC, IRON, RETICCTPCT in the last 72 hours. Sepsis Labs: Recent Labs  Lab 07/02/19 1543 07/02/19 1544 07/03/19 0320  WBC 8.1  --  8.7  LATICACIDVEN  --  1.5  --     Microbiology Recent Results (from the past 240 hour(s))  SARS CORONAVIRUS 2 (TAT 6-24 HRS) Nasopharyngeal Nasopharyngeal Swab     Status: None   Collection Time: 06/26/19  1:41 PM   Specimen: Nasopharyngeal Swab  Result Value Ref Range Status   SARS Coronavirus 2 NEGATIVE NEGATIVE Final    Comment: (NOTE) SARS-CoV-2 target nucleic acids are NOT DETECTED. The SARS-CoV-2 RNA is generally detectable in upper and lower respiratory specimens during the acute phase of infection. Negative results do not preclude SARS-CoV-2 infection, do not rule out co-infections with other pathogens, and should not be used as the sole basis for treatment or other patient management decisions. Negative results must be combined with clinical observations, patient history, and epidemiological information. The expected result is Negative. Fact Sheet for Patients: HairSlick.no Fact Sheet for Healthcare Providers: quierodirigir.com This test is not yet approved or cleared by the [place] FDA and  has been authorized for detection and/or diagnosis of SARS-CoV-2 by FDA under an Emergency Use Authorization (EUA). This EUA will remain  in effect (meaning this test can be used) for the duration of the COVID-19 declaration under Section 56 4(b)(1) of the  Act, 21 U.S.C. section 360bbb-3(b)(1), unless the authorization is terminated or revoked sooner. Performed at [facility], [address], [place], [place] [address]   Respiratory Panel by RT PCR (Flu A&B, Covid) - Nasopharyngeal Swab     Status: None   Collection Time: 07/02/19  3:27 PM   Specimen: Nasopharyngeal Swab  Result Value Ref Range Status   SARS Coronavirus 2 by RT PCR NEGATIVE NEGATIVE Final    Comment: (NOTE) SARS-CoV-2 target nucleic acids are NOT DETECTED. The SARS-CoV-2 RNA is generally detectable in upper respiratoy specimens during the acute phase of infection. The lowest concentration of SARS-CoV-2 viral copies this assay can detect is 131 copies/mL. A negative result does not preclude SARS-Cov-2 infection and should not be used as the sole basis for treatment or other patient management decisions. A  negative result may occur with  improper specimen collection/handling, submission of specimen other than nasopharyngeal swab, presence of viral mutation(s) within the areas targeted by this assay, and inadequate number of viral copies (<131 copies/mL). A negative result must be combined with clinical observations, patient history, and epidemiological information. The expected result is Negative. Fact Sheet for Patients:  PinkCheek.be Fact Sheet for Healthcare Providers:  GravelBags.it This test is not yet ap proved or cleared by the [place] FDA and  has been authorized for detection and/or diagnosis of SARS-CoV-2 by FDA under an Emergency Use Authorization (EUA). This EUA will remain  in effect (meaning this test can be used) for the duration of the COVID-19 declaration under Section 564(b)(1) of the Act, 21 U.S.C. section 360bbb-3(b)(1), unless the authorization is terminated or revoked sooner.    Influenza A by PCR NEGATIVE NEGATIVE Final   Influenza B by PCR NEGATIVE NEGATIVE Final     Comment: (NOTE) The Xpert Xpress SARS-CoV-2/FLU/RSV assay is intended as an aid in  the diagnosis of influenza from Nasopharyngeal swab specimens and  should not be used as a sole basis for treatment. Nasal washings and  aspirates are unacceptable for Xpert Xpress SARS-CoV-2/FLU/RSV  testing. Fact Sheet for Patients: PinkCheek.be Fact Sheet for Healthcare Providers: GravelBags.it This test is not yet approved or cleared by the [place] FDA and  has been authorized for detection and/or diagnosis of SARS-CoV-2 by  FDA under an Emergency Use Authorization (EUA). This EUA will remain  in effect (meaning this test can be used) for the duration of the  Covid-19 declaration under Section 564(b)(1) of the Act, 21  U.S.C. section 360bbb-3(b)(1), unless the authorization is  terminated or revoked. Performed at [facility], [place] [address], [place], [place] [address]     Procedures and diagnostic studies:  DG Chest Portable 1 View  Result Date: 07/02/2019 CLINICAL DATA:  Cough, shortness of breath, hypoxia EXAM: PORTABLE CHEST 1 VIEW COMPARISON:  06/26/2019 FINDINGS: The heart size and mediastinal contours are within normal limits. Both lungs are clear. The visualized skeletal structures are unremarkable. IMPRESSION: No acute abnormality of the lungs in AP portable projection. Electronically Signed   By: Eddie Candle M.D.   On: 07/02/2019 15:57    Medications:   . diclofenac Sodium  2 g Topical QID  . enoxaparin (LOVENOX) injection  40 mg Subcutaneous Daily  . ipratropium-albuterol  3 mL Nebulization QID  . methylPREDNISolone (SOLU-MEDROL) injection  60 mg Intravenous Q6H  . nicotine  21 mg Transdermal Daily   Continuous Infusions: . levofloxacin (LEVAQUIN) IV 750 mg (07/04/19 0055)     LOS: 1 day   Geradine Girt  [facility]   How to contact the [facility] Attending or Consulting provider [place] or covering  provider during after hours [place], for this patient?  1. Check the care team in [facility] and look for a) attending/consulting TRH provider listed and b) the [facility] team listed 2. Log into [email or web address] and use [place]'s universal password to access. If you do not have the password, please contact the hospital operator. 3. Locate the [facility] provider you are looking for under [facility] and page to a number that you can be directly reached. 4. If you still have difficulty reaching the provider, please page the [facility] (Director on Call) for the Hospitalists listed on amion for assistance.  07/04/2019, 12:05 PM

## 2019-07-04 NOTE — Plan of Care (Signed)
  Problem: Education: Goal: Knowledge of General Education information will improve Description: Including pain rating scale, medication(s)/side effects and non-pharmacologic comfort measures Outcome: Progressing   Problem: Health Behavior/Discharge Planning: Goal: Ability to manage health-related needs will improve Outcome: Progressing   Problem: Clinical Measurements: Goal: Ability to maintain clinical measurements within normal limits will improve Outcome: Progressing   Problem: Clinical Measurements: Goal: Cardiovascular complication will be avoided Outcome: Progressing   

## 2019-07-05 MED ORDER — ALBUTEROL SULFATE HFA 108 (90 BASE) MCG/ACT IN AERS: 1.0000 | INHALATION_SPRAY | Freq: Four times a day (QID) | RESPIRATORY_TRACT | 1 refills | Status: DC | PRN

## 2019-07-05 MED ORDER — GUAIFENESIN 100 MG/5ML PO SOLN: 5.0000 mL | ORAL | 0 refills | Status: DC | PRN

## 2019-07-05 MED ORDER — DOXYCYCLINE HYCLATE 100 MG PO TABS: 100.0000 mg | ORAL_TABLET | Freq: Two times a day (BID) | ORAL | 0 refills | Status: DC

## 2019-07-05 MED ORDER — PREDNISONE 20 MG PO TABS: 40.0000 mg | ORAL_TABLET | Freq: Every day | ORAL | 0 refills | Status: DC

## 2019-07-05 MED ORDER — PREDNISONE 20 MG PO TABS: 40.0000 mg | ORAL_TABLET | Freq: Every day | ORAL | Status: DC

## 2019-07-05 MED FILL — predniSONE 20 MG TABS: 20 | 5 days supply | Qty: 10 | Fill #0

## 2019-07-05 MED FILL — SM TUSSIN MUCUS-CONG 200 MG: 100 | 6 days supply | Qty: 236 | Fill #0

## 2019-07-05 MED FILL — DOXYCYCLINE HYCLATE 100 MG: 100 | 2 days supply | Qty: 4 | Fill #0

## 2019-07-05 MED FILL — ALBUTEROL SULFATE HFA 108 (: 108 (90 BAS | 25 days supply | Qty: 18 | Fill #0

## 2019-07-05 NOTE — TOC Initial Note (Signed)
Transition of Care [facility]) - Initial/Assessment Note    Patient Details  Name: Jesse Walters MRN: [id number] Date of Birth: [birth year]  [facility] CM/SW Contact:    Kingsley Plan, RN Phone Number: 07/05/2019, 12:43 PM  Clinical Narrative:                 Patient from home alone. Scheduled hospital follow up appointment July 09, 2019 placed on AVS. Patient aware. Provided medications through [facility] and petty cash.   Provided bus pass for patient to go home   Expected Discharge Plan: Home/Self Care Barriers to Discharge: No Barriers Identified   Patient Goals and CMS Choice Patient states their goals for this hospitalization and ongoing recovery are:: to go home CMS Medicare.gov Compare Post Acute Care list provided to:: Patient Choice offered to / list presented to : NA  Expected Discharge Plan and Services Expected Discharge Plan: Home/Self Care   Discharge Planning Services: CM Consult   Living arrangements for the past 2 months: Single Family Home Expected Discharge Date: 07/05/19               DME Arranged: N/A DME Agency: NA       HH Arranged: NA          Prior Living Arrangements/Services Living arrangements for the past 2 months: Single Family Home Lives with:: Self Patient language and need for interpreter reviewed:: Yes Do you feel safe going back to the place where you live?: Yes      Need for Family Participation in Patient Care: Yes (Comment) Care giver support system in place?: Yes (comment)   Criminal Activity/Legal Involvement Pertinent to Current Situation/Hospitalization: No - Comment as needed  Activities of Daily Living Home Assistive Devices/Equipment: None ADL Screening (condition at time of admission) Patient's cognitive ability adequate to safely complete daily activities?: Yes Is the patient deaf or have difficulty hearing?: No Does the patient have difficulty seeing, even when wearing glasses/contacts?: No Does the  patient have difficulty concentrating, remembering, or making decisions?: No Patient able to express need for assistance with ADLs?: Yes Does the patient have difficulty dressing or bathing?: No Independently performs ADLs?: Yes (appropriate for developmental age) Does the patient have difficulty walking or climbing stairs?: Yes Weakness of Legs: None Weakness of Arms/Hands: None  Permission Sought/Granted   Permission granted to share information with : No              Emotional Assessment Appearance:: Appears stated age Attitude/Demeanor/Rapport: Engaged Affect (typically observed): Accepting Orientation: : Oriented to Self, Oriented to Place, Oriented to  Time, Oriented to Situation Alcohol / Substance Use: Not Applicable Psych Involvement: No (comment)  Admission diagnosis:  SOB (shortness of breath) [R06.02] Tachycardia [R00.0] COPD exacerbation (HCC) [J44.1] Chest pain, unspecified type [R07.9] Patient Active Problem List   Diagnosis Date Noted  . Acute on chronic respiratory failure with hypoxia (HCC) 07/02/2019  . Tobacco abuse 07/02/2019  . COPD exacerbation (HCC) 07/02/2019  . Pain, dental 10/25/2013   PCP:  Patient, No Pcp Per Pharmacy:   [facility] [id number] - [place], [place] - [phone] AT [employer] OF [address] & [address] [place] [address] Phone: [phone] Fax: [phone]  [facility] [place], [place] - [address] [place] [address] Phone: [phone] Fax: [phone]     Social Determinants of Health (SDOH) Interventions    Readmission Risk Interventions No flowsheet data found.

## 2019-07-05 NOTE — Progress Notes (Signed)
Pt alert and oriented x4.   Pt  Oxygen saturation while at rest , sitting on bed is 97%.  Pt oxygen saturation while ambulating on room air is between 91%-93%. Pt denied SOB or dizziness.

## 2019-07-05 NOTE — Discharge Instructions (Signed)

## 2019-07-05 NOTE — Discharge Summary (Signed)
Physician Discharge Summary  Jesse Walters ELF:[id number] DOB: [birth year] DOA: 07/02/2019  PCP: Patient, No Pcp Per  Admit date: 07/02/2019 Discharge date: 07/05/2019  Admitted From: Home Discharge disposition: Home   Recommendations for Outpatient Follow-Up:   1. Continue education on smoking cessation 2. Patient made appointment at [facility]   Discharge Diagnosis:   Principal Problem:   Acute on chronic respiratory failure with hypoxia (HCC) Active Problems:   Tobacco abuse   COPD exacerbation (HCC)    Discharge Condition: Improved.  Diet recommendation: Low sodium, heart healthy  Wound care: None.  Code status: Full.   History of Present Illness:   Jesse Walters is a 49 y.o. male with medical history significant of seasonal allergies and tobacco abuse who presented with sudden onset of shortness of breath and cough.  Patient went to the local fire department where he was initiated on breathing treatment when he was found to be hypoxic and wheezing excessively.  He was seen at [facility] a week ago with shortness of breath.  He reported history of COPD but not able to take any of his medications at the time.  Patient was seen evaluated and treated.  He was discharged home with breathing treatments.  Today however he came to the ER where he has been excessively wheezing.  Treatment with steroids and breathing treatments was only minimal relief.  He was having oxygen saturation in the 80s.  Patient admitted to the hospital for further work-up.  ED Course: Temperature 98.7 blood pressure 153/77 pulse 101 respirate 28 oxygen sat 87% on room air.  CBC and chemistry entirely within normal.  Chest x-ray showed no acute findings.  Patient appears to have excessive wheezing with COPD exacerbation and is being admitted to the hospital for further treatment.   Hospital Course by Problem:   acute respiratory failure with hypoxia due to suspected  COPD exacerbation. -IV steroid changed to p.o. steroids with a burst -Patient given prescription for albuterol inhaler -Patient arranged follow-up in wellness center -Mucinex -No need for O2  tobacco abuse: -Patient states he quit 2 weeks ago    Medical Consultants:      Discharge Exam:   Vitals:   07/05/19 0500 07/05/19 0729  BP: 101/67   Pulse: 65   Resp: 18   Temp: 97.8 F (36.6 C)   SpO2: 94% 97%   Vitals:   07/04/19 2057 07/04/19 2351 07/05/19 0500 07/05/19 0729  BP:  110/73 101/67   Pulse:  63 65   Resp:  18 18   Temp:  98 F (36.7 C) 97.8 F (36.6 C)   TempSrc:  Oral Oral   SpO2: 95% 96% 94% 97%  Weight:      Height:        General exam: Appears calm and comfortable.   The results of significant diagnostics from this hospitalization (including imaging, microbiology, ancillary and laboratory) are listed below for reference.     Procedures and Diagnostic Studies:   DG Chest Portable 1 View  Result Date: 07/02/2019 CLINICAL DATA:  Cough, shortness of breath, hypoxia EXAM: PORTABLE CHEST 1 VIEW COMPARISON:  06/26/2019 FINDINGS: The heart size and mediastinal contours are within normal limits. Both lungs are clear. The visualized skeletal structures are unremarkable. IMPRESSION: No acute abnormality of the lungs in AP portable projection. Electronically Signed   By: Lauralyn Primes M.D.   On: 07/02/2019 15:57     Labs:   Basic Metabolic Panel: Recent  Labs  Lab 07/02/19 1543 07/03/19 0320  NA 139 135  K 4.0 4.1  CL 107 107  CO2 22 20*  GLUCOSE 102* 137*  BUN 11 13  CREATININE 0.82 0.77  CALCIUM 8.6* 8.8*   GFR Estimated Creatinine Clearance: 112 mL/min (by C-G formula based on SCr of 0.77 mg/dL). Liver Function Tests: Recent Labs  Lab 07/02/19 1543 07/03/19 0320  AST 17 16  ALT 17 18  ALKPHOS 62 58  BILITOT 0.8 1.3*  PROT 5.9* 6.0*  ALBUMIN 3.4* 3.2*   Recent Labs  Lab 07/02/19 1543  LIPASE 16   No results for input(s): AMMONIA  in the last 168 hours. Coagulation profile No results for input(s): INR, PROTIME in the last 168 hours.  CBC: Recent Labs  Lab 07/02/19 1543 07/03/19 0320  WBC 8.1 8.7  NEUTROABS 6.3  --   HGB 13.7 13.6  HCT 41.7 40.4  MCV 91.6 89.0  PLT 354 339   Cardiac Enzymes: No results for input(s): CKTOTAL, CKMB, CKMBINDEX, TROPONINI in the last 168 hours. BNP: Invalid input(s): POCBNP CBG: No results for input(s): GLUCAP in the last 168 hours. D-Dimer Recent Labs    07/02/19 1543  DDIMER 0.37   Hgb A1c No results for input(s): HGBA1C in the last 72 hours. Lipid Profile No results for input(s): CHOL, HDL, LDLCALC, TRIG, CHOLHDL, LDLDIRECT in the last 72 hours. Thyroid function studies No results for input(s): TSH, T4TOTAL, T3FREE, THYROIDAB in the last 72 hours.  Invalid input(s): FREET3 Anemia work up No results for input(s): VITAMINB12, FOLATE, FERRITIN, TIBC, IRON, RETICCTPCT in the last 72 hours. Microbiology Recent Results (from the past 240 hour(s))  SARS CORONAVIRUS 2 (TAT 6-24 HRS) Nasopharyngeal Nasopharyngeal Swab     Status: None   Collection Time: 06/26/19  1:41 PM   Specimen: Nasopharyngeal Swab  Result Value Ref Range Status   SARS Coronavirus 2 NEGATIVE NEGATIVE Final    Comment: (NOTE) SARS-CoV-2 target nucleic acids are NOT DETECTED. The SARS-CoV-2 RNA is generally detectable in upper and lower respiratory specimens during the acute phase of infection. Negative results do not preclude SARS-CoV-2 infection, do not rule out co-infections with other pathogens, and should not be used as the sole basis for treatment or other patient management decisions. Negative results must be combined with clinical observations, patient history, and epidemiological information. The expected result is Negative. Fact Sheet for Patients: SugarRoll.be Fact Sheet for Healthcare Providers: https://www.woods-mathews.com/ This test is not  yet approved or cleared by the [place] FDA and  has been authorized for detection and/or diagnosis of SARS-CoV-2 by FDA under an Emergency Use Authorization (EUA). This EUA will remain  in effect (meaning this test can be used) for the duration of the COVID-19 declaration under Section 56 4(b)(1) of the Act, 21 U.S.C. section 360bbb-3(b)(1), unless the authorization is terminated or revoked sooner. Performed at [facility], [place] [address], [place], [place] [address]   Respiratory Panel by RT PCR (Flu A&B, Covid) - Nasopharyngeal Swab     Status: None   Collection Time: 07/02/19  3:27 PM   Specimen: Nasopharyngeal Swab  Result Value Ref Range Status   SARS Coronavirus 2 by RT PCR NEGATIVE NEGATIVE Final    Comment: (NOTE) SARS-CoV-2 target nucleic acids are NOT DETECTED. The SARS-CoV-2 RNA is generally detectable in upper respiratoy specimens during the acute phase of infection. The lowest concentration of SARS-CoV-2 viral copies this assay can detect is 131 copies/mL. A negative result does not preclude SARS-Cov-2  infection and should not be used as the sole basis for treatment or other patient management decisions. A negative result may occur with  improper specimen collection/handling, submission of specimen other than nasopharyngeal swab, presence of viral mutation(s) within the areas targeted by this assay, and inadequate number of viral copies (<131 copies/mL). A negative result must be combined with clinical observations, patient history, and epidemiological information. The expected result is Negative. Fact Sheet for Patients:  https://www.moore.com/ Fact Sheet for Healthcare Providers:  https://www.young.biz/ This test is not yet ap proved or cleared by the [place] FDA and  has been authorized for detection and/or diagnosis of SARS-CoV-2 by FDA under an Emergency Use Authorization (EUA). This EUA will remain  in  effect (meaning this test can be used) for the duration of the COVID-19 declaration under Section 564(b)(1) of the Act, 21 U.S.C. section 360bbb-3(b)(1), unless the authorization is terminated or revoked sooner.    Influenza A by PCR NEGATIVE NEGATIVE Final   Influenza B by PCR NEGATIVE NEGATIVE Final    Comment: (NOTE) The Xpert Xpress SARS-CoV-2/FLU/RSV assay is intended as an aid in  the diagnosis of influenza from Nasopharyngeal swab specimens and  should not be used as a sole basis for treatment. Nasal washings and  aspirates are unacceptable for Xpert Xpress SARS-CoV-2/FLU/RSV  testing. Fact Sheet for Patients: https://www.moore.com/ Fact Sheet for Healthcare Providers: https://www.young.biz/ This test is not yet approved or cleared by the [place] FDA and  has been authorized for detection and/or diagnosis of SARS-CoV-2 by  FDA under an Emergency Use Authorization (EUA). This EUA will remain  in effect (meaning this test can be used) for the duration of the  Covid-19 declaration under Section 564(b)(1) of the Act, 21  U.S.C. section 360bbb-3(b)(1), unless the authorization is  terminated or revoked. Performed at [facility], [address] [place], [place] [address]      Discharge Instructions:   Discharge Instructions    Diet general   Complete by: As directed    Increase activity slowly   Complete by: As directed      Allergies as of 07/05/2019      Reactions   Dilaudid [hydromorphone Hcl] Hives      Medication List    STOP taking these medications   amLODipine 5 MG tablet Commonly known as: NORVASC   benzonatate 100 MG capsule Commonly known as: TESSALON   promethazine-dextromethorphan 6.25-15 MG/5ML syrup Commonly known as: PROMETHAZINE-DM     TAKE these medications   acetaminophen 500 MG tablet Commonly known as: TYLENOL Take 1,000 mg by mouth every 6 (six) hours as needed (for inflammation).     albuterol 108 (90 Base) MCG/ACT inhaler Commonly known as: VENTOLIN HFA Inhale 1-2 puffs into the lungs every 6 (six) hours as needed for wheezing or shortness of breath. What changed: Another medication with the same name was removed. Continue taking this medication, and follow the directions you see here.   doxycycline 100 MG tablet Commonly known as: VIBRA-TABS Take 1 tablet (100 mg total) by mouth every 12 (twelve) hours.   guaiFENesin 100 MG/5ML Soln Commonly known as: ROBITUSSIN Take 5 mLs (100 mg total) by mouth every 4 (four) hours as needed for cough or to loosen phlegm.   predniSONE 20 MG tablet Commonly known as: DELTASONE Take 2 tablets (40 mg total) by mouth daily with breakfast. Start taking on: July 06, 2019      Follow-up Information    [facility]. Go on 07/09/2019.   Why: at 2:30pm Contact information: [address] [place] [address] [phone]           Time coordinating discharge: 35 min  Signed:  Joseph Art DO  [facility] 07/05/2019, 11:15 AM

## 2019-07-05 NOTE — Progress Notes (Signed)
Jesse Walters to be D/C'd Home per MD order.  Discussed with the patient and all questions fully answered.   VSS, Skin clean, dry and intact without evidence of skin break down, no evidence of skin tears noted. IV catheter discontinued intact. Site without signs and symptoms of complications. Dressing and pressure applied.   An After Visit Summary was printed and given to the patient.    D/C education completed with patient/family including follow up instructions, medication list, d/c activities limitations if indicated, with other d/c instructions as indicated by MD - patient able to verbalize understanding, all questions fully answered.    Patient instructed to return to ED, call 911, or call MD for any changes in condition.

## 2019-07-09 ENCOUNTER — Ambulatory Visit: Payer: Self-pay | Attending: Internal Medicine | Admitting: Internal Medicine

## 2019-07-09 ENCOUNTER — Other Ambulatory Visit: Payer: Self-pay

## 2019-07-09 ENCOUNTER — Encounter: Payer: Self-pay | Admitting: Internal Medicine

## 2019-07-09 VITALS — BP 131/81 | HR 66 | Temp 98.1°F | Resp 16 | Ht 70.0 in | Wt 174.2 lb

## 2019-07-09 DIAGNOSIS — F17200 Nicotine dependence, unspecified, uncomplicated: Secondary | ICD-10-CM | POA: Insufficient documentation

## 2019-07-09 DIAGNOSIS — R0781 Pleurodynia: Secondary | ICD-10-CM

## 2019-07-09 DIAGNOSIS — R030 Elevated blood-pressure reading, without diagnosis of hypertension: Secondary | ICD-10-CM

## 2019-07-09 DIAGNOSIS — J449 Chronic obstructive pulmonary disease, unspecified: Secondary | ICD-10-CM

## 2019-07-09 DIAGNOSIS — Z09 Encounter for follow-up examination after completed treatment for conditions other than malignant neoplasm: Secondary | ICD-10-CM

## 2019-07-09 MED ORDER — ALBUTEROL SULFATE HFA 108 (90 BASE) MCG/ACT IN AERS: 1.0000 | INHALATION_SPRAY | Freq: Four times a day (QID) | RESPIRATORY_TRACT | 1 refills | Status: DC | PRN

## 2019-07-09 MED ORDER — DICLOFENAC SODIUM 1 % EX GEL: 2.0000 g | Freq: Two times a day (BID) | CUTANEOUS | 2 refills | Status: DC | PRN

## 2019-07-09 MED ORDER — BUDESONIDE-FORMOTEROL FUMARATE 80-4.5 MCG/ACT IN AERO: 2.0000 | INHALATION_SPRAY | Freq: Two times a day (BID) | RESPIRATORY_TRACT | 3 refills | Status: DC

## 2019-07-09 MED FILL — SYMBICORT 80-4.5 MCG INH: 80-4.5 | 30 days supply | Qty: 10 | Fill #0

## 2019-07-09 MED FILL — DICLOFENAC SODIUM 1 % GEL: 1 | 25 days supply | Qty: 100 | Fill #0

## 2019-07-09 NOTE — Progress Notes (Signed)
Pt states his pain is coming from his left side  

## 2019-07-09 NOTE — Progress Notes (Signed)
Patient ID: Jesse Walters, male    DOB: 07/18/1970  MRN: [id number]  CC: Hospitalization Follow-up   Subjective: Jesse Walters is a 49 y.o. male who presents for hospital follow-up and to establish care. His concerns today include:  Patient with history of COPD and tobacco dependence  Patient reports having no previous PCP. He was hospitalized 2/26-07/05/2019 with acute respiratory failure with hypoxia due to COPD exacerbation.  He responded to treatment and was able to be discharged without oxygen.  Chest x-ray revealed no acute findings.  Today: Patient has completed the doxycycline.  He has 2 more days left of prednisone.  He feels that his breathing is better and back to baseline.  He is having to use albuterol inhaler once a day.  Denies any increased cough at this time.  He continues to smoke but has cut back significantly.  Previously smoking a pack a day but now down to about 5 a day.  He is trying to quit.  Blood pressure noted to be elevated today.  He denies any previous history of hypertension.  Complains today of some pain on the left side that started today.  He points to the anterior left lower rib cage.  No recent trauma to this area.  However he states that he does a lot of pushing and lifting at work and not sure if he has pulled a muscle there.  He missed his bus and had to walk 5 miles to come to his appointment today.  The pain started while he was walking.  He denies any nausea or vomiting.  Past medical, social, family history reviewed.  Patient Active Problem List   Diagnosis Date Noted  . Acute on chronic respiratory failure with hypoxia ([place]) 07/02/2019  . Tobacco abuse 07/02/2019  . COPD exacerbation ([place]) 07/02/2019  . Pain, dental 10/25/2013     Current Outpatient Medications on File Prior to Visit  Medication Sig Dispense Refill  . acetaminophen (TYLENOL) 500 MG tablet Take 1,000 mg by mouth every 6 (six) hours as needed (for inflammation).    Marland Kitchen  doxycycline (VIBRA-TABS) 100 MG tablet Take 1 tablet (100 mg total) by mouth every 12 (twelve) hours. (Patient not taking: Reported on 07/09/2019) 4 tablet 0  . guaiFENesin (ROBITUSSIN) 100 MG/5ML SOLN Take 5 mLs (100 mg total) by mouth every 4 (four) hours as needed for cough or to loosen phlegm. 236 mL 0  . predniSONE (DELTASONE) 20 MG tablet Take 2 tablets (40 mg total) by mouth daily with breakfast. 10 tablet 0   No current facility-administered medications on file prior to visit.    Allergies  Allergen Reactions  . Dilaudid [Hydromorphone Hcl] Hives    Social History   Socioeconomic History  . Marital status: Single    Spouse name: Not on file  . Number of children: Not on file  . Years of education: Not on file  . Highest education level: Not on file  Occupational History  . Not on file  Tobacco Use  . Smoking status: Current Every Day Smoker    Types: Cigarettes  . Smokeless tobacco: Never Used  . Tobacco comment: marijuana  Substance and Sexual Activity  . Alcohol use: Yes    Alcohol/week: 7.0 standard drinks    Types: 7 Cans of beer per week  . Drug use: Not Currently    Types: Marijuana    Comment: 2 weeks ago last smoked  . Sexual activity: Not on file  Other Topics Concern  .  Not on file  Social History Narrative  . Not on file   Social Determinants of Health   Financial Resource Strain:   . Difficulty of Paying Living Expenses: Not on file  Food Insecurity:   . Worried About Programme researcher, broadcasting/film/video in the Last Year: Not on file  . Ran Out of Food in the Last Year: Not on file  Transportation Needs:   . Lack of Transportation (Medical): Not on file  . Lack of Transportation (Non-Medical): Not on file  Physical Activity:   . Days of Exercise per Week: Not on file  . Minutes of Exercise per Session: Not on file  Stress:   . Feeling of Stress : Not on file  Social Connections:   . Frequency of Communication with Friends and Family: Not on file  . Frequency  of Social Gatherings with Friends and Family: Not on file  . Attends Religious Services: Not on file  . Active Member of Clubs or Organizations: Not on file  . Attends Banker Meetings: Not on file  . Marital Status: Not on file  Intimate Partner Violence:   . Fear of Current or Ex-Partner: Not on file  . Emotionally Abused: Not on file  . Physically Abused: Not on file  . Sexually Abused: Not on file    Family History  Problem Relation Age of Onset  . Cancer Mother     Past Surgical History:  Procedure Laterality Date  . HERNIA REPAIR      ROS: Review of Systems Negative except as stated above  PHYSICAL EXAM: BP 131/81   Pulse 66   Temp 98.1 F (36.7 C)   Resp 16   Ht 5\' 10"  (1.778 m)   Wt 174 lb 3.2 oz (79 kg)   SpO2 99%   BMI 25.00 kg/m   Physical Exam  General appearance - alert, well appearing, middle-aged African-American male and in no distress Mental status - normal mood, behavior, speech, dress, motor activity, and thought processes Eyes - pupils equal and reactive, extraocular eye movements intact Nose - normal and patent, no erythema, discharge or polyps Mouth - mucous membranes moist, pharynx normal without lesions Chest -breath sounds slightly decreased bilaterally but no wheezes or rhonchi heard Heart - normal rate, regular rhythm, normal S1, S2, no murmurs, rubs, clicks or gallops Abdomen - soft, nontender, nondistended, no masses or organomegaly Musculoskeletal -mild tenderness on palpation over the left lower anterior cage.  No overlying skin changes noted. Extremities -mild clubbing of the fingernails CMP Latest Ref Rng & Units 07/03/2019 07/02/2019 06/26/2019  Glucose 70 - 99 mg/dL 06/28/2019) 401(U) -  BUN 6 - 20 mg/dL 13 11 -  Creatinine 272(Z - 1.24 mg/dL 3.66 4.40 -  Sodium 3.47 - 145 mmol/L 135 139 -  Potassium 3.5 - 5.1 mmol/L 4.1 4.0 4.2  Chloride 98 - 111 mmol/L 107 107 -  CO2 22 - 32 mmol/L 20(L) 22 -  Calcium 8.9 - 10.3 mg/dL  425) 9.5(G) -  Total Protein 6.5 - 8.1 g/dL 6.0(L) 5.9(L) -  Total Bilirubin 0.3 - 1.2 mg/dL 3.8(V) 0.8 -  Alkaline Phos 38 - 126 U/L 58 62 -  AST 15 - 41 U/L 16 17 -  ALT 0 - 44 U/L 18 17 -   Lipid Panel  No results found for: CHOL, TRIG, HDL, CHOLHDL, VLDL, LDLCALC, LDLDIRECT  CBC    Component Value Date/Time   WBC 8.7 07/03/2019 0320   RBC 4.54 07/03/2019 0320  HGB 13.6 07/03/2019 0320   HCT 40.4 07/03/2019 0320   PLT 339 07/03/2019 0320   MCV 89.0 07/03/2019 0320   MCH 30.0 07/03/2019 0320   MCHC 33.7 07/03/2019 0320   RDW 13.0 07/03/2019 0320   LYMPHSABS 1.1 07/02/2019 1543   MONOABS 0.3 07/02/2019 1543   EOSABS 0.3 07/02/2019 1543   BASOSABS 0.1 07/02/2019 1543    ASSESSMENT AND PLAN: 1. Hospital discharge follow-up 2. Chronic obstructive pulmonary disease, unspecified COPD type (HCC) Advised to quit smoking. Add Symbicort.  Went over use.  Advised to rinse mouth with warm water after each use. Recommend getting Pneumovax vaccine but patient deferred today as he was in a rush to get to work. - budesonide-formoterol (SYMBICORT) 80-4.5 MCG/ACT inhaler; Inhale 2 puffs into the lungs 2 (two) times daily.  Dispense: 1 Inhaler; Refill: 3 - albuterol (VENTOLIN HFA) 108 (90 Base) MCG/ACT inhaler; Inhale 1-2 puffs into the lungs every 6 (six) hours as needed for wheezing or shortness of breath.  Dispense: 18 g; Refill: 1  3. Tobacco dependence Advised to quit.  Commended him on how he has cut back.  Patient wanting to quit.  We discussed methods to help him quit.  Patient declines medications to help with smoking cessation.  He states he will continue to cut back with the goal of quitting.  Encouraged him to set a quit date.  Less than 5 minutes spent on counseling.  4. Rib pain on left side Pain seems to be musculoskeletal in nature.  We will try him with Voltaren gel - diclofenac Sodium (VOLTAREN) 1 % GEL; Apply 2 g topically 2 (two) times daily as needed.  Dispense: 100  g; Refill: 2  5. Elevated blood pressure reading DASH diet discussed and encouraged.  We will recheck blood pressure on follow-up visit    Patient was given the opportunity to ask questions.  Patient verbalized understanding of the plan and was able to repeat key elements of the plan.   No orders of the defined types were placed in this encounter.    Requested Prescriptions   Signed Prescriptions Disp Refills  . budesonide-formoterol (SYMBICORT) 80-4.5 MCG/ACT inhaler 1 Inhaler 3    Sig: Inhale 2 puffs into the lungs 2 (two) times daily.  Marland Kitchen albuterol (VENTOLIN HFA) 108 (90 Base) MCG/ACT inhaler 18 g 1    Sig: Inhale 1-2 puffs into the lungs every 6 (six) hours as needed for wheezing or shortness of breath.  . diclofenac Sodium (VOLTAREN) 1 % GEL 100 g 2    Sig: Apply 2 g topically 2 (two) times daily as needed.    Return in about 2 months (around 09/08/2019).  Jonah Blue, MD, FACP

## 2019-08-22 ENCOUNTER — Emergency Department (HOSPITAL_COMMUNITY)
Admission: EM | Admit: 2019-08-22 | Discharge: 2019-08-22 | Disposition: A | Discharge status: Home / Self Care | Payer: Self-pay | Attending: Emergency Medicine | Admitting: Emergency Medicine

## 2019-08-22 ENCOUNTER — Emergency Department (HOSPITAL_COMMUNITY): Payer: Self-pay

## 2019-08-22 ENCOUNTER — Encounter (HOSPITAL_COMMUNITY): Payer: Self-pay

## 2019-08-22 DIAGNOSIS — J449 Chronic obstructive pulmonary disease, unspecified: Secondary | ICD-10-CM

## 2019-08-22 DIAGNOSIS — Z79899 Other long term (current) drug therapy: Secondary | ICD-10-CM | POA: Insufficient documentation

## 2019-08-22 DIAGNOSIS — J441 Chronic obstructive pulmonary disease with (acute) exacerbation: Secondary | ICD-10-CM | POA: Insufficient documentation

## 2019-08-22 DIAGNOSIS — F17210 Nicotine dependence, cigarettes, uncomplicated: Secondary | ICD-10-CM | POA: Insufficient documentation

## 2019-08-22 MED ORDER — PREDNISONE 20 MG PO TABS: 60.0000 mg | ORAL_TABLET | Freq: Every day | ORAL | 0 refills | Status: DC

## 2019-08-22 MED ORDER — ALBUTEROL SULFATE HFA 108 (90 BASE) MCG/ACT IN AERS: 2.0000 | INHALATION_SPRAY | RESPIRATORY_TRACT | 1 refills | Status: DC | PRN

## 2019-08-22 MED ORDER — ALBUTEROL SULFATE HFA 108 (90 BASE) MCG/ACT IN AERS
2.0000 | INHALATION_SPRAY | RESPIRATORY_TRACT | Status: DC | PRN
  Administered 2019-08-22: 2 via RESPIRATORY_TRACT
  Filled 2019-08-22: qty 6.7

## 2019-08-22 MED ORDER — BUDESONIDE-FORMOTEROL FUMARATE 80-4.5 MCG/ACT IN AERO: 2.0000 | INHALATION_SPRAY | Freq: Two times a day (BID) | RESPIRATORY_TRACT | 3 refills | Status: DC

## 2019-08-22 MED ORDER — IPRATROPIUM-ALBUTEROL 0.5-2.5 (3) MG/3ML IN SOLN
3.0000 mL | Freq: Once | RESPIRATORY_TRACT | Status: AC
  Administered 2019-08-22: 3 mL via RESPIRATORY_TRACT
  Filled 2019-08-22: qty 3

## 2019-08-22 MED ORDER — METHYLPREDNISOLONE SODIUM SUCC 125 MG IJ SOLR: 125.0000 mg | Freq: Once | INTRAMUSCULAR | Status: DC

## 2019-08-22 NOTE — ED Notes (Signed)
Pt sitting up in bed. NAD noted. Full monitor on. Pt denies any needs. Neb treatment given. Will continue to monitor.

## 2019-08-22 NOTE — ED Notes (Signed)
Pt sitting up in bed. NAD noted. Pt denies any needs and is ready for d/c 

## 2019-08-22 NOTE — ED Triage Notes (Signed)
Pt brought in by EMS for SOB, wheezing and coughing. Pt states he started having trouble breathing around 0100 that became worse. Pt has a hx of COPD and asthma and ran out of albuterol. EMS gave solumedrol en route and fire gave albuterol PTA.

## 2019-08-22 NOTE — ED Provider Notes (Signed)
[facility]-EMERGENCY DEPT Provider Note   CSN: [id number] Arrival date & time: 08/22/19  0430   History Chief Complaint  Patient presents with  . Shortness of Breath    Jesse Walters is a 49 y.o. male.  The history is provided by the patient.  Shortness of Breath He has history of COPD and comes in because of cough and shortness of breath.  He was doing well until 1 AM when he started nonproductive cough.  And about 3 AM, he noted that he was having difficulty breathing.  He denies fever, chills, sweats.  He denies any sick contacts and specifically denies exposure to COVID-19.  He has not received the COVID-19 vaccination.  Of note, he had an inhaler which had run out and he had been using a friend's inhaler and nebulizer but returned those 2 days ago.  He was brought in by ambulance and received albuterol and methylprednisolone prior to arrival.  Past Medical History:  Diagnosis Date  . Seasonal allergic rhinitis     Patient Active Problem List   Diagnosis Date Noted  . Tobacco dependence 07/09/2019  . Acute on chronic respiratory failure with hypoxia (HCC) 07/02/2019  . Tobacco abuse 07/02/2019  . Chronic obstructive pulmonary disease (HCC) 07/02/2019  . Pain, dental 10/25/2013    Past Surgical History:  Procedure Laterality Date  . HERNIA REPAIR         Family History  Problem Relation Age of Onset  . Cancer Mother     Social History   Tobacco Use  . Smoking status: Current Every Day Smoker    Packs/day: 0.50    Types: Cigarettes  . Smokeless tobacco: Never Used  . Tobacco comment: marijuana  Substance Use Topics  . Alcohol use: Yes    Alcohol/week: 7.0 standard drinks    Types: 7 Cans of beer per week  . Drug use: Yes    Types: Marijuana    Comment: 2 weeks ago last smoked    Home Medications Prior to Admission medications   Medication Sig Start Date End Date Taking? Authorizing Provider  acetaminophen (TYLENOL) 500 MG  tablet Take 1,000 mg by mouth every 6 (six) hours as needed (for inflammation).    [provider]  albuterol (VENTOLIN HFA) 108 (90 Base) MCG/ACT inhaler Inhale 1-2 puffs into the lungs every 6 (six) hours as needed for wheezing or shortness of breath. 07/09/19   Marcine Matar, MD  budesonide-formoterol (SYMBICORT) 80-4.5 MCG/ACT inhaler Inhale 2 puffs into the lungs 2 (two) times daily. 07/09/19   Marcine Matar, MD  diclofenac Sodium (VOLTAREN) 1 % GEL Apply 2 g topically 2 (two) times daily as needed. 07/09/19   Marcine Matar, MD  doxycycline (VIBRA-TABS) 100 MG tablet Take 1 tablet (100 mg total) by mouth every 12 (twelve) hours. Patient not taking: Reported on 07/09/2019 07/05/19   Joseph Art, DO  guaiFENesin (ROBITUSSIN) 100 MG/5ML SOLN Take 5 mLs (100 mg total) by mouth every 4 (four) hours as needed for cough or to loosen phlegm. 07/05/19   Joseph Art, DO  predniSONE (DELTASONE) 20 MG tablet Take 2 tablets (40 mg total) by mouth daily with breakfast. 07/06/19   Joseph Art, DO    Allergies    Dilaudid [hydromorphone hcl]  Review of Systems   Review of Systems  Respiratory: Positive for shortness of breath.   All other systems reviewed and are negative.   Physical Exam Updated Vital Signs BP Marland Kitchen)  157/95 (BP Location: Right Arm)   Pulse 71   Temp 98 F (36.7 C) (Oral)   Resp (!) 30   Ht 5\' 10"  (1.778 m)   Wt 72.6 kg   SpO2 99%   BMI 22.96 kg/m   Physical Exam Vitals and nursing note reviewed.   49 year old male, resting comfortably and in no acute distress. Vital signs are significant for elevated blood pressure and respiratory rate. Oxygen saturation is 99%, which is normal. Head is normocephalic and atraumatic. PERRLA, EOMI. Oropharynx is clear. Neck is nontender and supple without adenopathy or JVD. Back is nontender and there is no CVA tenderness. Lungs a slightly prolonged exhalation phase.  Coarse wheezes and rhonchi are noted with forced  exhalation, but not noted on tidal breathing.  No rales are appreciated. Chest is nontender. Heart has regular rate and rhythm without murmur. Abdomen is soft, flat, nontender without masses or hepatosplenomegaly and peristalsis is normoactive. Extremities have no cyanosis or edema, full range of motion is present. Skin is warm and dry without rash. Neurologic: Mental status is normal, cranial nerves are intact, there are no motor or sensory deficits.  ED Results / Procedures / Treatment  EKG Normal sinus rhythm 60 bpm.  Normal axis.  Normal intervals.  Probable left ventricular hypertrophy.  When compared with ECG of July 02, 2019, no significant changes are seen.  Radiology DG Chest 2 View  Result Date: 08/22/2019 CLINICAL DATA:  Shortness of breath EXAM: CHEST - 2 VIEW COMPARISON:  07/02/2019 FINDINGS: The heart size and mediastinal contours are within normal limits. Both lungs are clear. The visualized skeletal structures are unremarkable. IMPRESSION: No active cardiopulmonary disease. Electronically Signed   By: 07/04/2019 M.D.   On: 08/22/2019 05:43    Procedures Procedures   Medications Ordered in ED Medications  albuterol (VENTOLIN HFA) 108 (90 Base) MCG/ACT inhaler 2 puff (has no administration in time range)  ipratropium-albuterol (DUONEB) 0.5-2.5 (3) MG/3ML nebulizer solution 3 mL (3 mLs Nebulization Given 08/22/19 0524)    ED Course  I have reviewed the triage vital signs and the nursing notes.  Pertinent labs & imaging results that were available during my care of the patient were reviewed by me and considered in my medical decision making (see chart for details).  MDM Rules/Calculators/A&P COPD exacerbation.  Old records are reviewed, and he had been given a prescription for budesonide-formoterol inhaler at an office visit on March 5, but there were 3 refills included with the prescription.  He will be given albuterol/ipratropium in the emergency department and  will give dose of methylprednisolone.  He will need a rescue inhaler in addition to his maintenance inhaler.  ECG is unchanged from prior.  Chest x-ray shows no active disease.  Following nebulizer treatment, he feels like he is back to his baseline.  On reexam, lungs are clear.  He is given a new prescription for his budesonide/formoterol inhaler and also prescription for albuterol inhaler.  Sent home on a short course of prednisone.  He has a follow-up appointment with his PCP scheduled on May 5, and he is advised to keep that appointment.  Advised to try to stop smoking.  Return precautions discussed.  Final Clinical Impression(s) / ED Diagnoses Final diagnoses:  COPD exacerbation (HCC)    Rx / DC Orders ED Discharge Orders         Ordered    budesonide-formoterol (SYMBICORT) 80-4.5 MCG/ACT inhaler  2 times daily     08/22/19 0559  predniSONE (DELTASONE) 20 MG tablet  Daily     08/22/19 0600    albuterol (VENTOLIN HFA) 108 (90 Base) MCG/ACT inhaler  Every 4 hours PRN     08/22/19 1497           Delora Fuel, MD 02/63/78 [phone]

## 2019-08-22 NOTE — Discharge Instructions (Addendum)
Please remember to use the Symbicort Inhaler twice a day, even if you think you are not having any problems breathing. Use the Albuterol Inhaler when you are actually having trouble breathing or are coughing.  Return if you have breathing problems that do not get better with using your inhalers.

## 2019-08-23 MED FILL — predniSONE 20 MG TABS: 20 | 5 days supply | Qty: 15 | Fill #0

## 2019-08-23 MED FILL — ALBUTEROL SULFATE HFA 108 (: 108 (90 BAS | 25 days supply | Qty: 18 | Fill #0

## 2019-09-08 MED FILL — SYMBICORT 80-4.5 MCG INH: 80-4.5 | 30 days supply | Qty: 10 | Fill #0

## 2019-09-28 ENCOUNTER — Ambulatory Visit: Payer: Self-pay | Admitting: Internal Medicine

## 2019-10-09 ENCOUNTER — Other Ambulatory Visit: Payer: Self-pay

## 2019-10-09 ENCOUNTER — Emergency Department (HOSPITAL_COMMUNITY): Payer: Self-pay

## 2019-10-09 ENCOUNTER — Emergency Department (HOSPITAL_COMMUNITY)
Admission: EM | Admit: 2019-10-09 | Discharge: 2019-10-09 | Disposition: A | Discharge status: Home / Self Care | Payer: Self-pay | Attending: Emergency Medicine | Admitting: Emergency Medicine

## 2019-10-09 ENCOUNTER — Encounter (HOSPITAL_COMMUNITY): Payer: Self-pay | Admitting: Emergency Medicine

## 2019-10-09 ENCOUNTER — Other Ambulatory Visit (HOSPITAL_COMMUNITY): Payer: Self-pay | Admitting: Emergency Medicine

## 2019-10-09 DIAGNOSIS — F17210 Nicotine dependence, cigarettes, uncomplicated: Secondary | ICD-10-CM | POA: Insufficient documentation

## 2019-10-09 DIAGNOSIS — J441 Chronic obstructive pulmonary disease with (acute) exacerbation: Secondary | ICD-10-CM | POA: Insufficient documentation

## 2019-10-09 DIAGNOSIS — Z888 Allergy status to other drugs, medicaments and biological substances status: Secondary | ICD-10-CM | POA: Insufficient documentation

## 2019-10-09 DIAGNOSIS — M549 Dorsalgia, unspecified: Secondary | ICD-10-CM | POA: Insufficient documentation

## 2019-10-09 DIAGNOSIS — J449 Chronic obstructive pulmonary disease, unspecified: Secondary | ICD-10-CM

## 2019-10-09 DIAGNOSIS — Z7951 Long term (current) use of inhaled steroids: Secondary | ICD-10-CM | POA: Insufficient documentation

## 2019-10-09 HISTORY — DX: Chronic obstructive pulmonary disease, unspecified: J44.9

## 2019-10-09 MED ORDER — PREDNISONE 20 MG PO TABS: ORAL_TABLET | ORAL | 0 refills | Status: DC

## 2019-10-09 MED ORDER — BUDESONIDE-FORMOTEROL FUMARATE 80-4.5 MCG/ACT IN AERO: 2.0000 | INHALATION_SPRAY | Freq: Two times a day (BID) | RESPIRATORY_TRACT | 1 refills | Status: DC

## 2019-10-09 MED ORDER — ALBUTEROL SULFATE HFA 108 (90 BASE) MCG/ACT IN AERS: 2.0000 | INHALATION_SPRAY | RESPIRATORY_TRACT | 1 refills | Status: DC | PRN

## 2019-10-09 MED ORDER — ALBUTEROL SULFATE HFA 108 (90 BASE) MCG/ACT IN AERS
8.0000 | INHALATION_SPRAY | Freq: Once | RESPIRATORY_TRACT | Status: AC
  Administered 2019-10-09: 8 via RESPIRATORY_TRACT
  Filled 2019-10-09: qty 6.7

## 2019-10-09 MED ORDER — PREDNISONE 20 MG PO TABS
60.0000 mg | ORAL_TABLET | Freq: Once | ORAL | Status: AC
  Administered 2019-10-09: 60 mg via ORAL
  Filled 2019-10-09: qty 3

## 2019-10-09 NOTE — ED Triage Notes (Signed)
Arrived by EMS from home with c/o [facility] X2 hours. Ran out of inhaler 1 week ago. 98% room air tachypneic  at 30 bpm. NAD  0.3 epi IM HR 75

## 2019-10-09 NOTE — ED Provider Notes (Signed)
[facility]-EMERGENCY DEPT Provider Note   CSN: [id number] Arrival date & time: 10/09/19  9169     History Chief Complaint  Patient presents with  . Shortness of Breath    Jesse Walters is a 49 y.o. male.  HPI 49 year old male presents shortness of breath.  Started this morning at around 3 AM.  Feels similar to prior COPD exacerbations. Has had nasal congestion. No fever or chest pain. He has left sided back pain when he coughs. He states he always gets this type of pain with his exacerbations. No sick contacts. Given IM epi by EMS. No leg swelling.  Past Medical History:  Diagnosis Date  . COPD (chronic obstructive pulmonary disease) (HCC)   . Seasonal allergic rhinitis     Patient Active Problem List   Diagnosis Date Noted  . Tobacco dependence 07/09/2019  . Acute on chronic respiratory failure with hypoxia (HCC) 07/02/2019  . Tobacco abuse 07/02/2019  . Chronic obstructive pulmonary disease (HCC) 07/02/2019  . Pain, dental 10/25/2013    Past Surgical History:  Procedure Laterality Date  . HERNIA REPAIR         Family History  Problem Relation Age of Onset  . Cancer Mother     Social History   Tobacco Use  . Smoking status: Current Every Day Smoker    Packs/day: 0.50    Types: Cigarettes  . Smokeless tobacco: Never Used  . Tobacco comment: marijuana  Substance Use Topics  . Alcohol use: Yes    Alcohol/week: 7.0 standard drinks    Types: 7 Cans of beer per week  . Drug use: Yes    Types: Marijuana    Comment: 2 weeks ago last smoked    Home Medications Prior to Admission medications   Medication Sig Start Date End Date Taking? Authorizing Provider  acetaminophen (TYLENOL) 500 MG tablet Take 1,000 mg by mouth every 6 (six) hours as needed (for inflammation).    [provider]  albuterol (VENTOLIN HFA) 108 (90 Base) MCG/ACT inhaler Inhale 2 puffs into the lungs every 4 (four) hours as needed for wheezing or shortness of  breath. 10/09/19   Pricilla Loveless, MD  budesonide-formoterol (SYMBICORT) 80-4.5 MCG/ACT inhaler Inhale 2 puffs into the lungs 2 (two) times daily. 10/09/19   Pricilla Loveless, MD  diclofenac Sodium (VOLTAREN) 1 % GEL Apply 2 g topically 2 (two) times daily as needed. 07/09/19   Marcine Matar, MD  guaiFENesin (ROBITUSSIN) 100 MG/5ML SOLN Take 5 mLs (100 mg total) by mouth every 4 (four) hours as needed for cough or to loosen phlegm. 07/05/19   Joseph Art, DO  predniSONE (DELTASONE) 20 MG tablet 2 tabs po daily x 4 days 10/10/19   Pricilla Loveless, MD    Allergies    Dilaudid [hydromorphone hcl]  Review of Systems   Review of Systems  Constitutional: Negative for fever.  HENT: Positive for congestion.   Respiratory: Positive for cough and shortness of breath.   Cardiovascular: Negative for chest pain and leg swelling.  All other systems reviewed and are negative.   Physical Exam Updated Vital Signs BP (!) 153/80 (BP Location: Left Arm)   Pulse 85   Temp 97.8 F (36.6 C) (Oral)   Resp 16   SpO2 92%   Physical Exam Vitals and nursing note reviewed.  Constitutional:      General: He is not in acute distress.    Appearance: He is well-developed. He is not ill-appearing or diaphoretic.  HENT:  Head: Normocephalic and atraumatic.     Right Ear: External ear normal.     Left Ear: External ear normal.     Nose: Nose normal.  Eyes:     General:        Right eye: No discharge.        Left eye: No discharge.  Cardiovascular:     Rate and Rhythm: Normal rate and regular rhythm.     Heart sounds: Normal heart sounds.  Pulmonary:     Effort: Pulmonary effort is normal. No tachypnea, accessory muscle usage or respiratory distress.     Breath sounds: Wheezing (diffuse, mild) present.  Abdominal:     Palpations: Abdomen is soft.     Tenderness: There is no abdominal tenderness.  Musculoskeletal:     Cervical back: Neck supple.     Right lower leg: No edema.     Left lower leg: No  edema.  Skin:    General: Skin is warm and dry.  Neurological:     Mental Status: He is alert.  Psychiatric:        Mood and Affect: Mood is not anxious.     ED Results / Procedures / Treatments   Labs (all labs ordered are listed, but only abnormal results are displayed) Labs Reviewed - No data to display  EKG EKG Interpretation  Date/Time:  Saturday October 09 2019 07:12:36 EDT Ventricular Rate:  60 PR Interval:    QRS Duration: 87 QT Interval:  408 QTC Calculation: 408 R Axis:   82 Text Interpretation: Sinus rhythm diffuse ST elevation c/w early repol no significant change since April 2021 Confirmed by Pricilla Loveless [phone]) on 10/09/2019 7:29:35 AM   Radiology DG Chest Portable 1 View  Result Date: 10/09/2019 CLINICAL DATA:  Difficulty breathing for 5 hours.  COPD EXAM: PORTABLE CHEST 1 VIEW COMPARISON:  08/22/2019 FINDINGS: Biapical emphysematous appearance. There is no edema, consolidation, effusion, or pneumothorax. Normal heart size and mediastinal contours. IMPRESSION: 1. No acute finding. 2. Emphysema. Electronically Signed   By: Marnee Spring M.D.   On: 10/09/2019 08:02    Procedures Procedures (including critical care time)  Medications Ordered in ED Medications  predniSONE (DELTASONE) tablet 60 mg (60 mg Oral Given 10/09/19 0724)  albuterol (VENTOLIN HFA) 108 (90 Base) MCG/ACT inhaler 8 puff (8 puffs Inhalation Given 10/09/19 1962)    ED Course  I have reviewed the triage vital signs and the nursing notes.  Pertinent labs & imaging results that were available during my care of the patient were reviewed by me and considered in my medical decision making (see chart for details).    MDM Rules/Calculators/A&P                      Patient's presentation is consistent with COPD exacerbation.  He is feeling better after albuterol and was also given prednisone.  Chest x-ray personally reviewed and is clear.  ECG is benign and at baseline.  He is not hypoxic.  My  suspicion for bacterial pneumonia, pulmonary embolism, etc. is low.  He has back pain that he thinks is from coughing and states this is a recurrent and chronic issue.  It is not midline and highly doubt spinal cord emergency.  He is afebrile.  We will refill his Symbicort and albuterol and give steroid burst.  Counseled on stopping smoking. Final Clinical Impression(s) / ED Diagnoses Final diagnoses:  COPD exacerbation (HCC)    Rx / DC Orders ED Discharge  Orders         Ordered    predniSONE (DELTASONE) 20 MG tablet     10/09/19 0827    budesonide-formoterol (SYMBICORT) 80-4.5 MCG/ACT inhaler  2 times daily     10/09/19 0827    albuterol (VENTOLIN HFA) 108 (90 Base) MCG/ACT inhaler  Every 4 hours PRN     10/09/19 0827           Sherwood Gambler, MD 10/09/19 [phone]

## 2019-10-09 NOTE — Discharge Instructions (Signed)
Start the prednisone tomorrow, 6/6. You were given your first dose today in the emergency department.  It is very important to stop smoking.

## 2019-10-16 ENCOUNTER — Emergency Department (HOSPITAL_COMMUNITY)
Admission: EM | Admit: 2019-10-16 | Discharge: 2019-10-16 | Disposition: A | Discharge status: Home / Self Care | Payer: Self-pay | Attending: Emergency Medicine | Admitting: Emergency Medicine

## 2019-10-16 ENCOUNTER — Encounter (HOSPITAL_COMMUNITY): Payer: Self-pay | Admitting: Emergency Medicine

## 2019-10-16 ENCOUNTER — Emergency Department (HOSPITAL_COMMUNITY): Payer: Self-pay

## 2019-10-16 ENCOUNTER — Other Ambulatory Visit: Payer: Self-pay

## 2019-10-16 DIAGNOSIS — Z5321 Procedure and treatment not carried out due to patient leaving prior to being seen by health care provider: Secondary | ICD-10-CM | POA: Insufficient documentation

## 2019-10-16 DIAGNOSIS — R05 Cough: Secondary | ICD-10-CM | POA: Insufficient documentation

## 2019-10-16 DIAGNOSIS — R062 Wheezing: Secondary | ICD-10-CM | POA: Insufficient documentation

## 2019-10-16 DIAGNOSIS — R0981 Nasal congestion: Secondary | ICD-10-CM | POA: Insufficient documentation

## 2019-10-16 LAB — COMPREHENSIVE METABOLIC PANEL
ALT: 14 U/L (ref 0–44)
AST: 23 U/L (ref 15–41)
Albumin: 3.9 g/dL (ref 3.5–5.0)
Alkaline Phosphatase: 57 U/L (ref 38–126)
Anion gap: 9 (ref 5–15)
BUN: 14 mg/dL (ref 6–20)
CO2: 22 mmol/L (ref 22–32)
Calcium: 8.7 mg/dL — ABNORMAL LOW (ref 8.9–10.3)
Chloride: 107 mmol/L (ref 98–111)
Creatinine, Ser: 0.93 mg/dL (ref 0.61–1.24)
GFR calc Af Amer: >60 mL/min (ref >60)
GFR calc non Af Amer: >60 mL/min (ref >60)
Glucose, Bld: 103 mg/dL — ABNORMAL HIGH (ref 70–99)
Potassium: 3.8 mmol/L (ref 3.5–5.1)
Sodium: 138 mmol/L (ref 135–145)
Total Bilirubin: 1.7 mg/dL — ABNORMAL HIGH (ref 0.3–1.2)
Total Protein: 6.5 g/dL (ref 6.5–8.1)

## 2019-10-16 LAB — CBC WITH DIFFERENTIAL/PLATELET
Abs Immature Granulocytes: 0.01 10*3/uL (ref 0.00–0.07)
Basophils Absolute: 0.1 10*3/uL (ref 0.0–0.1)
Basophils Relative: 2 %
Eosinophils Absolute: 0.5 10*3/uL (ref 0.0–0.5)
Eosinophils Relative: 8 %
HCT: 41.5 % (ref 39.0–52.0)
Hemoglobin: 13.6 g/dL (ref 13.0–17.0)
Immature Granulocytes: 0 %
Lymphocytes Relative: 47 %
Lymphs Abs: 3.1 10*3/uL (ref 0.7–4.0)
MCH: 30 pg (ref 26.0–34.0)
MCHC: 32.8 g/dL (ref 30.0–36.0)
MCV: 91.4 fL (ref 80.0–100.0)
Monocytes Absolute: 0.8 10*3/uL (ref 0.1–1.0)
Monocytes Relative: 11 %
Neutro Abs: 2.1 10*3/uL (ref 1.7–7.7)
Neutrophils Relative %: 32 %
Platelets: 300 10*3/uL (ref 150–400)
RBC: 4.54 MIL/uL (ref 4.22–5.81)
RDW: 13.7 % (ref 11.5–15.5)
WBC: 6.6 10*3/uL (ref 4.0–10.5)
nRBC: 0 % (ref 0.0–0.2)

## 2019-10-16 MED ORDER — ALBUTEROL SULFATE HFA 108 (90 BASE) MCG/ACT IN AERS
2.0000 | INHALATION_SPRAY | Freq: Once | RESPIRATORY_TRACT | Status: AC
  Administered 2019-10-16: 2 via RESPIRATORY_TRACT

## 2019-10-16 MED ORDER — ALBUTEROL SULFATE HFA 108 (90 BASE) MCG/ACT IN AERS
INHALATION_SPRAY | RESPIRATORY_TRACT | Status: AC
  Filled 2019-10-16: qty 6.7

## 2019-10-16 NOTE — ED Notes (Addendum)
Pt called multiple times no answer 

## 2019-10-16 NOTE — ED Triage Notes (Signed)
Patient reports wheezing with chest congestion and productive cough this evening , he ran out of his inhaler , denies fever or chills .

## 2019-11-22 ENCOUNTER — Encounter (HOSPITAL_COMMUNITY): Payer: Self-pay | Admitting: Emergency Medicine

## 2019-11-22 ENCOUNTER — Emergency Department (HOSPITAL_COMMUNITY): Payer: Self-pay

## 2019-11-22 ENCOUNTER — Other Ambulatory Visit: Payer: Self-pay

## 2019-11-22 ENCOUNTER — Emergency Department (HOSPITAL_COMMUNITY)
Admission: EM | Admit: 2019-11-22 | Discharge: 2019-11-22 | Disposition: A | Discharge status: Home / Self Care | Payer: Self-pay | Attending: Emergency Medicine | Admitting: Emergency Medicine

## 2019-11-22 DIAGNOSIS — Z20822 Contact with and (suspected) exposure to covid-19: Secondary | ICD-10-CM | POA: Insufficient documentation

## 2019-11-22 DIAGNOSIS — J449 Chronic obstructive pulmonary disease, unspecified: Secondary | ICD-10-CM

## 2019-11-22 DIAGNOSIS — Z760 Encounter for issue of repeat prescription: Secondary | ICD-10-CM | POA: Insufficient documentation

## 2019-11-22 DIAGNOSIS — J441 Chronic obstructive pulmonary disease with (acute) exacerbation: Secondary | ICD-10-CM | POA: Insufficient documentation

## 2019-11-22 DIAGNOSIS — Z7951 Long term (current) use of inhaled steroids: Secondary | ICD-10-CM | POA: Insufficient documentation

## 2019-11-22 DIAGNOSIS — F17210 Nicotine dependence, cigarettes, uncomplicated: Secondary | ICD-10-CM | POA: Insufficient documentation

## 2019-11-22 LAB — SARS CORONAVIRUS 2 BY RT PCR (HOSPITAL ORDER, PERFORMED IN ~~LOC~~ HOSPITAL LAB): SARS Coronavirus 2: NEGATIVE

## 2019-11-22 LAB — CBC
HCT: 44.6 % (ref 39.0–52.0)
Hemoglobin: 14.9 g/dL (ref 13.0–17.0)
MCH: 30.7 pg (ref 26.0–34.0)
MCHC: 33.4 g/dL (ref 30.0–36.0)
MCV: 91.8 fL (ref 80.0–100.0)
Platelets: 320 10*3/uL (ref 150–400)
RBC: 4.86 MIL/uL (ref 4.22–5.81)
RDW: 13 % (ref 11.5–15.5)
WBC: 5.4 10*3/uL (ref 4.0–10.5)
nRBC: 0 % (ref 0.0–0.2)

## 2019-11-22 LAB — BASIC METABOLIC PANEL
Anion gap: 10 (ref 5–15)
BUN: 11 mg/dL (ref 6–20)
CO2: 20 mmol/L — ABNORMAL LOW (ref 22–32)
Calcium: 9 mg/dL (ref 8.9–10.3)
Chloride: 107 mmol/L (ref 98–111)
Creatinine, Ser: 0.86 mg/dL (ref 0.61–1.24)
GFR calc Af Amer: >60 mL/min (ref >60)
GFR calc non Af Amer: >60 mL/min (ref >60)
Glucose, Bld: 100 mg/dL — ABNORMAL HIGH (ref 70–99)
Potassium: 4.2 mmol/L (ref 3.5–5.1)
Sodium: 137 mmol/L (ref 135–145)

## 2019-11-22 MED ORDER — AEROCHAMBER PLUS FLO-VU LARGE MISC: 1.0000 | Freq: Once | Status: AC

## 2019-11-22 MED ORDER — PREDNISONE 20 MG PO TABS
60.0000 mg | ORAL_TABLET | Freq: Once | ORAL | Status: AC
  Administered 2019-11-22: 60 mg via ORAL
  Filled 2019-11-22: qty 3

## 2019-11-22 MED ORDER — ALBUTEROL SULFATE HFA 108 (90 BASE) MCG/ACT IN AERS
4.0000 | INHALATION_SPRAY | Freq: Once | RESPIRATORY_TRACT | Status: AC
  Administered 2019-11-22: 4 via RESPIRATORY_TRACT
  Filled 2019-11-22: qty 6.7

## 2019-11-22 MED ORDER — PREDNISONE 50 MG PO TABS: 50.0000 mg | ORAL_TABLET | Freq: Every day | ORAL | 0 refills | Status: DC

## 2019-11-22 MED ORDER — AEROCHAMBER PLUS FLO-VU LARGE MISC
Status: AC
  Administered 2019-11-22: 1
  Filled 2019-11-22: qty 1

## 2019-11-22 MED ORDER — ALBUTEROL SULFATE (2.5 MG/3ML) 0.083% IN NEBU: 5.0000 mg | INHALATION_SOLUTION | Freq: Once | RESPIRATORY_TRACT | Status: DC

## 2019-11-22 MED ORDER — BUDESONIDE-FORMOTEROL FUMARATE 80-4.5 MCG/ACT IN AERO: 2.0000 | INHALATION_SPRAY | Freq: Two times a day (BID) | RESPIRATORY_TRACT | 1 refills | Status: DC

## 2019-11-22 NOTE — ED Provider Notes (Signed)
[facility]   CSN: [id number] Arrival date & time: 11/22/19  [id number]     History Chief Complaint  Patient presents with  . Shortness of Breath  . Medication Refill    Jesse Walters is a 49 y.o. male.  Jesse Walters is a 49 y.o. male with a history of COPD, seasonal allergies, tobacco dependence, who presents to the emergency department for evaluation of shortness of breath.  Patient states that for the past few days he feels that his breathing has been worsening, especially last night when he was sleeping.  He reports worsening wheezing and a cough productive of clear-white mucus.  Patient denies any fevers or chills.  Reports that he started to have some pain in his left upper back which she reports he typically has when his COPD exacerbation and breathing get worse.  He denies any anterior chest pain.  No lightheadedness or syncope.  No lower extremity swelling.  He states that he ran out of his albuterol and Symbicort about 2 weeks ago that he was prescribed after he was evaluated in the hospital for COPD exacerbation.  He has an appointment coming up with his primary care doctor on Friday but breathing got worse today and he could not wait to be seen.  He has not had his Covid vaccine, and states "I have not gotten Covid yet, if I get it then I will consider taking a shot".        Past Medical History:  Diagnosis Date  . COPD (chronic obstructive pulmonary disease) (HCC)   . COPD (chronic obstructive pulmonary disease) (HCC)   . Seasonal allergic rhinitis     Patient Active Problem List   Diagnosis Date Noted  . Tobacco dependence 07/09/2019  . Acute on chronic respiratory failure with hypoxia (HCC) 07/02/2019  . Tobacco abuse 07/02/2019  . Chronic obstructive pulmonary disease (HCC) 07/02/2019  . Pain, dental 10/25/2013    Past Surgical History:  Procedure Laterality Date  . HERNIA REPAIR         Family History   Problem Relation Age of Onset  . Cancer Mother     Social History   Tobacco Use  . Smoking status: Current Every Day Smoker    Packs/day: 0.50    Types: Cigarettes  . Smokeless tobacco: Never Used  . Tobacco comment: marijuana  Substance Use Topics  . Alcohol use: Yes    Alcohol/week: 7.0 standard drinks    Types: 7 Cans of beer per week  . Drug use: Yes    Types: Marijuana    Comment: 2 weeks ago last smoked    Home Medications Prior to Admission medications   Medication Sig Start Date End Date Taking? Authorizing Provider  albuterol (VENTOLIN HFA) 108 (90 Base) MCG/ACT inhaler Inhale 2 puffs into the lungs every 4 (four) hours as needed for wheezing or shortness of breath. 10/09/19  Yes Pricilla LovelessGoldston, Scott, MD  ibuprofen (ADVIL) 200 MG tablet Take 400 mg by mouth every 6 (six) hours as needed for mild pain (or muscle strains).   Yes [provider]  acetaminophen (TYLENOL) 500 MG tablet Take 1,000 mg by mouth every 6 (six) hours as needed (for inflammation). Patient not taking: Reported on 11/22/2019    [provider]  budesonide-formoterol (SYMBICORT) 80-4.5 MCG/ACT inhaler Inhale 2 puffs into the lungs 2 (two) times daily. 11/22/19   Dartha LodgeFord, Alaja Goldinger N, PA-C  diclofenac Sodium (VOLTAREN) 1 % GEL Apply 2 g topically 2 (  two) times daily as needed. Patient not taking: Reported on 11/22/2019 07/09/19   Marcine Matar, MD  guaiFENesin (ROBITUSSIN) 100 MG/5ML SOLN Take 5 mLs (100 mg total) by mouth every 4 (four) hours as needed for cough or to loosen phlegm. 07/05/19   Joseph Art, DO  predniSONE (DELTASONE) 50 MG tablet Take 1 tablet (50 mg total) by mouth daily for 4 days. 11/22/19 11/26/19  Dartha Lodge, PA-C    Allergies    Dilaudid [hydromorphone hcl]  Review of Systems   Review of Systems  Constitutional: Negative for chills and fever.  HENT: Negative.   Respiratory: Positive for cough, shortness of breath and wheezing.   Cardiovascular: Negative for chest  pain.  Gastrointestinal: Negative for abdominal pain, nausea and vomiting.  Musculoskeletal: Positive for back pain.  Neurological: Negative for dizziness, syncope and light-headedness.  All other systems reviewed and are negative.   Physical Exam Updated Vital Signs BP 121/84   Pulse 83   Temp 98.6 F (37 C) (Oral)   Resp 16   Ht 5\' 10"  (1.778 m)   Wt 72.6 kg   SpO2 94%   BMI 22.96 kg/m   Physical Exam Vitals and nursing note reviewed.  Constitutional:      General: He is not in acute distress.    Appearance: He is well-developed and normal weight. He is not ill-appearing or diaphoretic.  HENT:     Head: Normocephalic and atraumatic.  Eyes:     General:        Right eye: No discharge.        Left eye: No discharge.     Pupils: Pupils are equal, round, and reactive to light.  Cardiovascular:     Rate and Rhythm: Normal rate and regular rhythm.     Heart sounds: Normal heart sounds. No murmur heard.  No friction rub. No gallop.   Pulmonary:     Effort: Pulmonary effort is normal. No respiratory distress.     Breath sounds: Decreased breath sounds and wheezing present. No rales.     Comments: Respirations are equal and unlabored and patient is able to speak in full sentences on room air, satting at 94%, on exam he does have diffuse expiratory wheezes and some decreased air movement throughout, no increased respiratory effort or accessory muscle use, no respiratory distress Chest:     Chest wall: No tenderness.  Abdominal:     General: Bowel sounds are normal. There is no distension.     Palpations: Abdomen is soft. There is no mass.     Tenderness: There is no abdominal tenderness. There is no guarding.     Comments: Abdomen soft, nondistended, nontender to palpation in all quadrants without guarding or peritoneal signs  Musculoskeletal:        General: No deformity.     Cervical back: Neck supple.     Right lower leg: No tenderness. No edema.     Left lower leg: No  tenderness. No edema.  Skin:    General: Skin is warm and dry.     Capillary Refill: Capillary refill takes less than 2 seconds.  Neurological:     Mental Status: He is alert.     Coordination: Coordination normal.     Comments: Speech is clear, able to follow commands Moves extremities without ataxia, coordination intact  Psychiatric:        Mood and Affect: Mood normal.        Behavior: Behavior normal.  ED Results / Procedures / Treatments   Labs (all labs ordered are listed, but only abnormal results are displayed) Labs Reviewed  BASIC METABOLIC PANEL - Abnormal; Notable for the following components:      Result Value   CO2 20 (*)    Glucose, Bld 100 (*)    All other components within normal limits  SARS CORONAVIRUS 2 BY RT PCR [facility] ORDER, PERFORMED IN [facility])  CBC    EKG EKG Interpretation  Date/Time:  Monday November 22 2019 08:56:04 EDT Ventricular Rate:  79 PR Interval:  140 QRS Duration: 84 QT Interval:  350 QTC Calculation: 401 R Axis:   78 Text Interpretation: Normal sinus rhythm with sinus arrhythmia Normal ECG Confirmed by Tilden Fossa [phone]) on 11/22/2019 3:48:32 PM   Radiology DG Chest Port 1 View  Result Date: 11/22/2019 CLINICAL DATA:  COVID-19 positive, history of COPD EXAM: PORTABLE CHEST 1 VIEW COMPARISON:  Radiograph 10/16/2019 FINDINGS: Chronic mild hyperinflation and bronchitic features. No consolidation, features of edema, pneumothorax, or effusion. The cardiomediastinal contours are unremarkable. No acute osseous or soft tissue abnormality. IMPRESSION: Chronic mild hyperinflation and bronchitic features compatible with history of COPD. No acute cardiopulmonary abnormalities. Electronically Signed   By: Kreg Shropshire M.D.   On: 11/22/2019 16:54    Procedures Procedures (including critical care time)  Medications Ordered in ED Medications  albuterol (PROVENTIL) (2.5 MG/3ML) 0.083% nebulizer solution 5 mg (5 mg  Nebulization Not Given 11/22/19 1729)  predniSONE (DELTASONE) tablet 60 mg (60 mg Oral Given 11/22/19 1730)  albuterol (VENTOLIN HFA) 108 (90 Base) MCG/ACT inhaler 4 puff (4 puffs Inhalation Given 11/22/19 1731)  AeroChamber Plus Flo-Vu Large MISC 1 each (1 each Other Given 11/22/19 1731)    ED Course  I have reviewed the triage vital signs and the nursing notes.  Pertinent labs & imaging results that were available during my care of the patient were reviewed by me and considered in my medical decision making (see chart for details).    MDM Rules/Calculators/A&P                          49 year old male presents with shortness of breath, wheezing and cough for the past few days, worsening today, history of COPD and ran out of his albuterol and Symbicort 2 weeks ago.  On arrival he is not in acute respiratory distress but does have some diffuse wheezing and decreased air movement, no increased respiratory effort.  O2 sats are 94% on arrival and all other vitals normal aside from mild hypertension.  Will get basic labs, EKG, chest x-ray and Covid swab.  Symptoms began with wheezing and cough low suspicion for ACS or PE.  Lab work shows no leukocytosis, normal hemoglobin, CO2 of 20 and glucose of 100 but no other significant electrolyte derangements.  Covid is negative.  Chest x-ray consistent with COPD, no evidence of pneumonia.  EKG is unremarkable  After steroids and breathing treatment patient is feeling much improved, his O2 sats remained between 97-100% with ambulation and he is moving much more air on exam.  Will discharge with burst of prednisone, I have also sent in a refill prescription for his Symbicort and he was provided an albuterol inhaler here today.  He has a follow-up appointment with his PCP on Friday.  Return precautions discussed.  Counseled patient and encourage patient to get COVID-19 vaccine.  Final Clinical Impression(s) / ED Diagnoses Final diagnoses:  COPD exacerbation  (  HCC)  Medication refill    Rx / DC Orders ED Discharge Orders         Ordered    budesonide-formoterol (SYMBICORT) 80-4.5 MCG/ACT inhaler  2 times daily     Discontinue  Reprint     11/22/19 1917    predniSONE (DELTASONE) 50 MG tablet  Daily     Discontinue  Reprint     11/22/19 1917           Legrand Rams 11/22/19 2028    Tilden Fossa, MD 11/23/19 [phone]

## 2019-11-22 NOTE — Discharge Instructions (Signed)
Your evaluation today is reassuring, suspect symptoms are due to a COPD exacerbation, use albuterol as needed for wheezing and shortness of breath, take prednisone daily for the next 4 days, and use your Symbicort inhaler which [facility] twice daily every day to help maintain and prevent exacerbations.  Follow-up with your PCP later this week as planned.  Return for new or worsening symptoms.  Your Covid test today was negative.

## 2019-11-22 NOTE — ED Notes (Signed)
Given a Happy Meal

## 2019-11-22 NOTE — ED Notes (Signed)
Pt verbalized understanding of d/c instructions, follow up care, s/s requiring return to ed and prescriptions. Pt had no further quesitons and was transported to exit via wheelchair.

## 2019-11-22 NOTE — ED Notes (Signed)
Pt's oxygen saturation fluctuated between 97% and 100% on room air while ambulating 

## 2019-11-22 NOTE — ED Triage Notes (Signed)
Pt. Stated, I started having SOB this am around 3-4 oclock . I was told I have COPD. I an out of my medication about 2 weeks.

## 2019-11-23 MED FILL — !SYMBICORT 160-4.5 MCG INH: 160-4.5 | 30 days supply | Qty: 1 | Fill #0

## 2019-11-23 MED FILL — predniSONE 10 MG TABS: 10 | 4 days supply | Qty: 20 | Fill #0

## 2019-11-26 ENCOUNTER — Ambulatory Visit: Payer: Self-pay | Attending: Internal Medicine | Admitting: Internal Medicine

## 2019-11-26 ENCOUNTER — Encounter: Payer: Self-pay | Admitting: Internal Medicine

## 2019-11-26 ENCOUNTER — Other Ambulatory Visit: Payer: Self-pay

## 2019-11-26 VITALS — BP 146/86 | HR 66 | Resp 16 | Ht 70.0 in | Wt 165.0 lb

## 2019-11-26 DIAGNOSIS — F17200 Nicotine dependence, unspecified, uncomplicated: Secondary | ICD-10-CM

## 2019-11-26 DIAGNOSIS — I10 Essential (primary) hypertension: Secondary | ICD-10-CM

## 2019-11-26 DIAGNOSIS — Z1211 Encounter for screening for malignant neoplasm of colon: Secondary | ICD-10-CM

## 2019-11-26 DIAGNOSIS — Z7189 Other specified counseling: Secondary | ICD-10-CM

## 2019-11-26 DIAGNOSIS — J449 Chronic obstructive pulmonary disease, unspecified: Secondary | ICD-10-CM

## 2019-11-26 MED ORDER — PREDNISONE 10 MG PO TABS: ORAL_TABLET | ORAL | 0 refills | Status: DC

## 2019-11-26 MED ORDER — BUDESONIDE-FORMOTEROL FUMARATE 80-4.5 MCG/ACT IN AERO: 2.0000 | INHALATION_SPRAY | Freq: Two times a day (BID) | RESPIRATORY_TRACT | 6 refills | Status: DC

## 2019-11-26 MED ORDER — ALBUTEROL SULFATE HFA 108 (90 BASE) MCG/ACT IN AERS: 2.0000 | INHALATION_SPRAY | RESPIRATORY_TRACT | 6 refills | Status: DC | PRN

## 2019-11-26 MED ORDER — NICOTINE 21 MG/24HR TD PT24: 21.0000 mg | MEDICATED_PATCH | Freq: Every day | TRANSDERMAL | 0 refills | Status: DC

## 2019-11-26 MED ORDER — NICOTINE 14 MG/24HR TD PT24: 14.0000 mg | MEDICATED_PATCH | Freq: Every day | TRANSDERMAL | 0 refills | Status: DC

## 2019-11-26 MED ORDER — AMLODIPINE BESYLATE 5 MG PO TABS: 5.0000 mg | ORAL_TABLET | Freq: Every day | ORAL | 3 refills | Status: DC

## 2019-11-26 MED ORDER — ALBUTEROL SULFATE (2.5 MG/3ML) 0.083% IN NEBU: 2.5000 mg | INHALATION_SOLUTION | Freq: Four times a day (QID) | RESPIRATORY_TRACT | 1 refills | Status: DC | PRN

## 2019-11-26 MED FILL — ALBUTEROL SUL 2.5 MG/3 ML S: (2.5 MG/3ML | 12 days supply | Qty: 150 | Fill #0

## 2019-11-26 MED FILL — AMLODIPINE BESYLATE 5 MG TA: 5 | 30 days supply | Qty: 30 | Fill #0

## 2019-11-26 MED FILL — ALBUTEROL SULFATE HFA 108 (: 108 (90 BAS | 16 days supply | Qty: 18 | Fill #0

## 2019-11-26 MED FILL — NICOTINE 21 MG/24HR PATCH: 21 | 28 days supply | Qty: 28 | Fill #0

## 2019-11-26 NOTE — Progress Notes (Signed)
Patient ID: Jesse Walters, male    DOB: May 12, 1970  MRN: [id number]  CC: Hospitalization Follow-up (ED)   Subjective: Jesse Walters is a 49 y.o. male who presents for hosp f/u.  Last seen 07/2019 His concerns today include:  Patient with history of COPD and tobacco dependence  Pt seen in ER 11/22/19 with COPD exacerbation after running out of his inhalers 2 wks prior.  COVID test was negative.  CXR without acute ds.  Given Prednisone and RF on Symbicort.  Pt was not able to get the Symbicort.  He has a neb machine at home but no Albuterol treatments. He has completed Prednisone. Feeling like his breathing is back to base line.  -reports he is not smoking as much.  Down to 5 cigarettes a day from 1 pk a day.  He would like to try the nicotine patches to help him quit.  On last visit with [place] his blood pressure was elevated.  Patient has had several ER visits.  He reports that his blood pressure was elevated then to.  Denies any headaches or dizziness.  HM:  He was not planning to get COVID vaccine.  Never had colon cancer screen.  No fhx hx of colon CA.  Patient Active Problem List   Diagnosis Date Noted  . Tobacco dependence 07/09/2019  . Acute on chronic respiratory failure with hypoxia (HCC) 07/02/2019  . Tobacco abuse 07/02/2019  . Chronic obstructive pulmonary disease (HCC) 07/02/2019  . Pain, dental 10/25/2013     Current Outpatient Medications on File Prior to Visit  Medication Sig Dispense Refill  . guaiFENesin (ROBITUSSIN) 100 MG/5ML SOLN Take 5 mLs (100 mg total) by mouth every 4 (four) hours as needed for cough or to loosen phlegm. 236 mL 0  . ibuprofen (ADVIL) 200 MG tablet Take 400 mg by mouth every 6 (six) hours as needed for mild pain (or muscle strains).     No current facility-administered medications on file prior to visit.    Allergies  Allergen Reactions  . Dilaudid [Hydromorphone Hcl] Hives    Social History   Socioeconomic History  . Marital status: Single      Spouse name: Not on file  . Number of children: Not on file  . Years of education: Not on file  . Highest education level: Not on file  Occupational History  . Not on file  Tobacco Use  . Smoking status: Current Every Day Smoker    Packs/day: 0.50    Types: Cigarettes  . Smokeless tobacco: Never Used  . Tobacco comment: marijuana  Substance and Sexual Activity  . Alcohol use: Yes    Alcohol/week: 7.0 standard drinks    Types: 7 Cans of beer per week  . Drug use: Yes    Types: Marijuana    Comment: 2 weeks ago last smoked  . Sexual activity: Not on file  Other Topics Concern  . Not on file  Social History Narrative  . Not on file   Social Determinants of Health   Financial Resource Strain:   . Difficulty of Paying Living Expenses:   Food Insecurity:   . Worried About Programme researcher, broadcasting/film/video in the Last Year:   . Barista in the Last Year:   Transportation Needs:   . Freight forwarder (Medical):   Marland Kitchen Lack of Transportation (Non-Medical):   Physical Activity:   . Days of Exercise per Week:   . Minutes of Exercise per Session:  Stress:   . Feeling of Stress :   Social Connections:   . Frequency of Communication with Friends and Family:   . Frequency of Social Gatherings with Friends and Family:   . Attends Religious Services:   . Active Member of Clubs or Organizations:   . Attends BankerClub or Organization Meetings:   Marland Kitchen. Marital Status:   Intimate Partner Violence:   . Fear of Current or Ex-Partner:   . Emotionally Abused:   Marland Kitchen. Physically Abused:   . Sexually Abused:     Family History  Problem Relation Age of Onset  . Cancer Mother     Past Surgical History:  Procedure Laterality Date  . HERNIA REPAIR      ROS: Review of Systems Negative except as stated above  PHYSICAL EXAM: BP (!) 146/86   Pulse 66   Resp 16   Ht 5\' 10"  (1.778 m)   Wt 165 lb (74.8 kg)   SpO2 98%   BMI 23.68 kg/m   Physical Exam General appearance - alert, well  appearing, and in no distress Mental status - alert, oriented to person, place, and time Neck - supple, no significant adenopathy Chest - clear to auscultation, no wheezes, rales or rhonchi, symmetric air entry Heart - normal rate, regular rhythm, normal S1, S2, no murmurs, rubs, clicks or gallops Extremities - peripheral pulses normal, no pedal edema, no clubbing or cyanosis  CMP Latest Ref Rng & Units 11/22/2019 10/16/2019 07/03/2019  Glucose 70 - 99 mg/dL 161(W100(H) 960(A103(H) 540(J137(H)  BUN 6 - 20 mg/dL 11 14 13   Creatinine 0.61 - 1.24 mg/dL 8.110.86 9.140.93 7.820.77  Sodium 135 - 145 mmol/L 137 138 135  Potassium 3.5 - 5.1 mmol/L 4.2 3.8 4.1  Chloride 98 - 111 mmol/L 107 107 107  CO2 22 - 32 mmol/L 20(L) 22 20(L)  Calcium 8.9 - 10.3 mg/dL 9.0 9.5(A8.7(L) 2.1(H8.8(L)  Total Protein 6.5 - 8.1 g/dL - 6.5 6.0(L)  Total Bilirubin 0.3 - 1.2 mg/dL - 1.7(H) 1.3(H)  Alkaline Phos 38 - 126 U/L - 57 58  AST 15 - 41 U/L - 23 16  ALT 0 - 44 U/L - 14 18   Lipid Panel  No results found for: CHOL, TRIG, HDL, CHOLHDL, VLDL, LDLCALC, LDLDIRECT  CBC    Component Value Date/Time   WBC 5.4 11/22/2019 0919   RBC 4.86 11/22/2019 0919   HGB 14.9 11/22/2019 0919   HCT 44.6 11/22/2019 0919   PLT 320 11/22/2019 0919   MCV 91.8 11/22/2019 0919   MCH 30.7 11/22/2019 0919   MCHC 33.4 11/22/2019 0919   RDW 13.0 11/22/2019 0919   LYMPHSABS 3.1 10/16/2019 0104   MONOABS 0.8 10/16/2019 0104   EOSABS 0.5 10/16/2019 0104   BASOSABS 0.1 10/16/2019 0104    ASSESSMENT AND PLAN: 1. Chronic obstructive pulmonary disease, unspecified COPD type (HCC) Refills given on Symbicort, albuterol inhaler and albuterol nebulizer treatments.  Advised patient of the importance of using the Symbicort twice a day every day to help decrease inflammation in the lungs.  I have also given a prescription for prednisone for him to keep on hand to use as needed when he feels he is having another flareup. - budesonide-formoterol (SYMBICORT) 80-4.5 MCG/ACT inhaler;  Inhale 2 puffs into the lungs 2 (two) times daily.  Dispense: 1 Inhaler; Refill: 6 - albuterol (VENTOLIN HFA) 108 (90 Base) MCG/ACT inhaler; Inhale 2 puffs into the lungs every 4 (four) hours as needed for wheezing or shortness of breath.  Dispense:  18 g; Refill: 6 - albuterol (PROVENTIL) (2.5 MG/3ML) 0.083% nebulizer solution; Take 3 mLs (2.5 mg total) by nebulization every 6 (six) hours as needed for wheezing or shortness of breath.  Dispense: 150 mL; Refill: 1 - predniSONE (DELTASONE) 10 MG tablet; 3 tabs PO daily x 2 days then 2 tabs x 2 days then 1 tab x 1 day  Dispense: 11 tablet; Refill: 0  2. Tobacco dependence Advised to quit smoking.  Patient is willing and ready to give a trial of quitting.  We discussed methods to help him quit including the nicotine patches, Chantix and Wellbutrin.  He is wanting to try the nicotine patches.  I went over the stepdown approach with him.  We will start him with a 21 mg patch.  Less than 5 minutes spent on counseling. - nicotine (NICODERM CQ - DOSED IN MG/24 HOURS) 21 mg/24hr patch; Place 1 patch (21 mg total) onto the skin daily.  Dispense: 28 patch; Refill: 0 - nicotine (NICODERM CQ - DOSED IN MG/24 HOURS) 14 mg/24hr patch; Place 1 patch (14 mg total) onto the skin daily.  Dispense: 28 patch; Refill: 0  3. Essential hypertension Persistent elevation in blood pressure.  We will start him on a low-dose of amlodipine.  DASH diet discussed and encouraged. - amLODipine (NORVASC) 5 MG tablet; Take 1 tablet (5 mg total) by mouth daily.  Dispense: 30 tablet; Refill: 3  4. Screening for colon cancer Discussed colon cancer screening with him.  He is uninsured.  He is willing to do the fit test. - Fecal occult blood, imunochemical([facility])  5. Educated about COVID-19 virus infection Discussed with patient that the COVID-19 vaccines are relatively safe and effective.  Have encouraged him to get the vaccine.  I have told him that if he contracts Covid  infection it may be detrimental for him given his COPD.  He will consider getting the vaccine done.    Patient was given the opportunity to ask questions.  Patient verbalized understanding of the plan and was able to repeat key elements of the plan.   Orders Placed This Encounter  Procedures  . Fecal occult blood, imunochemical([facility])     Requested Prescriptions   Signed Prescriptions Disp Refills  . budesonide-formoterol (SYMBICORT) 80-4.5 MCG/ACT inhaler 1 Inhaler 6    Sig: Inhale 2 puffs into the lungs 2 (two) times daily.  Marland Kitchen albuterol (VENTOLIN HFA) 108 (90 Base) MCG/ACT inhaler 18 g 6    Sig: Inhale 2 puffs into the lungs every 4 (four) hours as needed for wheezing or shortness of breath.  . nicotine (NICODERM CQ - DOSED IN MG/24 HOURS) 21 mg/24hr patch 28 patch 0    Sig: Place 1 patch (21 mg total) onto the skin daily.  . nicotine (NICODERM CQ - DOSED IN MG/24 HOURS) 14 mg/24hr patch 28 patch 0    Sig: Place 1 patch (14 mg total) onto the skin daily.  Marland Kitchen albuterol (PROVENTIL) (2.5 MG/3ML) 0.083% nebulizer solution 150 mL 1    Sig: Take 3 mLs (2.5 mg total) by nebulization every 6 (six) hours as needed for wheezing or shortness of breath.  Marland Kitchen amLODipine (NORVASC) 5 MG tablet 30 tablet 3    Sig: Take 1 tablet (5 mg total) by mouth daily.  . predniSONE (DELTASONE) 10 MG tablet 11 tablet 0    Sig: 3 tabs PO daily x 2 days then 2 tabs x 2 days then 1 tab x 1 day    Return in about 3  months (around 02/26/2020).  Jonah Blue, MD, FACP

## 2019-11-26 NOTE — Patient Instructions (Signed)
I have sent prescriptions to the pharmacy for the albuterol and Symbicort inhalers.  I have also sent prescription for the nebulizer treatments.  Start the nicotine patches 21 mg daily.  Do that for a month and then step down to the 14 mg patches for 1 month.  Your blood pressure is elevated.  High blood pressure puts you at risk for heart attack and strokes.  We have started you on a medication called amlodipine to take once a day.  Try to limit salt in the foods as much as possible.

## 2019-12-15 MED FILL — NICOTINE 21 MG/24HR PATCH: 21 | 28 days supply | Qty: 28 | Fill #0

## 2019-12-15 MED FILL — PROVENTIL HFA 108 (90 BASE): 108 (90 BAS | 16 days supply | Qty: 7 | Fill #0

## 2019-12-15 MED FILL — ALBUTEROL SUL 2.5 MG/3 ML S: (2.5 MG/3ML | 12 days supply | Qty: 150 | Fill #0

## 2019-12-15 MED FILL — AMLODIPINE BESYLATE 5 MG TA: 5 | 30 days supply | Qty: 30 | Fill #0

## 2020-01-06 ENCOUNTER — Other Ambulatory Visit: Payer: Self-pay

## 2020-01-06 ENCOUNTER — Ambulatory Visit (HOSPITAL_COMMUNITY)
Admission: EM | Admit: 2020-01-06 | Discharge: 2020-01-06 | Disposition: A | Discharge status: Home / Self Care | Payer: Self-pay | Attending: Physician Assistant | Admitting: Physician Assistant

## 2020-01-06 ENCOUNTER — Encounter (HOSPITAL_COMMUNITY): Payer: Self-pay

## 2020-01-06 DIAGNOSIS — K0889 Other specified disorders of teeth and supporting structures: Secondary | ICD-10-CM

## 2020-01-06 DIAGNOSIS — K047 Periapical abscess without sinus: Secondary | ICD-10-CM

## 2020-01-06 MED ORDER — KETOROLAC TROMETHAMINE 30 MG/ML IJ SOLN
30.0000 mg | Freq: Once | INTRAMUSCULAR | Status: AC
  Administered 2020-01-06: 30 mg via INTRAMUSCULAR

## 2020-01-06 MED ORDER — AMOXICILLIN-POT CLAVULANATE 875-125 MG PO TABS: 1.0000 | ORAL_TABLET | Freq: Two times a day (BID) | ORAL | 0 refills | Status: AC

## 2020-01-06 MED ORDER — HYDROCODONE-ACETAMINOPHEN 5-325 MG PO TABS: 1.0000 | ORAL_TABLET | ORAL | 0 refills | Status: DC | PRN

## 2020-01-06 MED ORDER — KETOROLAC TROMETHAMINE 30 MG/ML IJ SOLN
INTRAMUSCULAR | Status: AC
  Filled 2020-01-06: qty 1

## 2020-01-06 NOTE — Discharge Instructions (Signed)
Take the augmentin as prescribed Take the vicodin for severe pain, do not drive, drink alcohol or operate machinery within 8 hours - if you develop a rash, stop take 2 benadryl and stop this medication  Follow up with your primary care and dentist  If severely worsening, high fever, return or go to the Emergency department

## 2020-01-06 NOTE — ED Provider Notes (Signed)
[facility]    CSN: [id number] Arrival date & time: 01/06/20  1513      History   Chief Complaint Chief Complaint  Patient presents with  . Dental Pain    HPI Jesse Walters is a 49 y.o. male.   Patient presents for  left upper dental pain and facial swelling.  Reports the tooth has been cracked for a long time and recently became painful over the last week.  He is developed swelling above the tooth and his left side of his face.  Reports is very painful.  Has been drinking but chewing is been quite painful.  Denies fever chills.  Denies difficulty swallowing.  Has a dentist appointment scheduled on the 12th.     Past Medical History:  Diagnosis Date  . COPD (chronic obstructive pulmonary disease) (HCC)   . COPD (chronic obstructive pulmonary disease) (HCC)   . Seasonal allergic rhinitis     Patient Active Problem List   Diagnosis Date Noted  . Essential hypertension 11/26/2019  . Tobacco dependence 07/09/2019  . Acute on chronic respiratory failure with hypoxia (HCC) 07/02/2019  . Tobacco abuse 07/02/2019  . Chronic obstructive pulmonary disease (HCC) 07/02/2019  . Pain, dental 10/25/2013    Past Surgical History:  Procedure Laterality Date  . HERNIA REPAIR         Home Medications    Prior to Admission medications   Medication Sig Start Date End Date Taking? Authorizing Provider  albuterol (PROVENTIL) (2.5 MG/3ML) 0.083% nebulizer solution Take 3 mLs (2.5 mg total) by nebulization every 6 (six) hours as needed for wheezing or shortness of breath. 11/26/19  Yes Marcine Matar, MD  albuterol (VENTOLIN HFA) 108 (90 Base) MCG/ACT inhaler Inhale 2 puffs into the lungs every 4 (four) hours as needed for wheezing or shortness of breath. 11/26/19  Yes Marcine Matar, MD  amLODipine (NORVASC) 5 MG tablet Take 1 tablet (5 mg total) by mouth daily. 11/26/19  Yes Marcine Matar, MD  ibuprofen (ADVIL) 200 MG tablet Take 400 mg by mouth every 6 (six) hours  as needed for mild pain (or muscle strains).   Yes [provider]  amoxicillin-clavulanate (AUGMENTIN) 875-125 MG tablet Take 1 tablet by mouth 2 (two) times daily for 10 days. 01/06/20 01/16/20  Nichlas Pitera, Veryl Speak, PA-C  budesonide-formoterol (SYMBICORT) 80-4.5 MCG/ACT inhaler Inhale 2 puffs into the lungs 2 (two) times daily. 11/26/19   Marcine Matar, MD  guaiFENesin (ROBITUSSIN) 100 MG/5ML SOLN Take 5 mLs (100 mg total) by mouth every 4 (four) hours as needed for cough or to loosen phlegm. 07/05/19   Joseph Art, DO  HYDROcodone-acetaminophen (NORCO/VICODIN) 5-325 MG tablet Take 1-2 tablets by mouth every 4 (four) hours as needed. 01/06/20   Gerlean Cid, Veryl Speak, PA-C  nicotine (NICODERM CQ - DOSED IN MG/24 HOURS) 14 mg/24hr patch Place 1 patch (14 mg total) onto the skin daily. 11/26/19   Marcine Matar, MD  nicotine (NICODERM CQ - DOSED IN MG/24 HOURS) 21 mg/24hr patch Place 1 patch (21 mg total) onto the skin daily. 11/26/19   Marcine Matar, MD  predniSONE (DELTASONE) 10 MG tablet 3 tabs PO daily x 2 days then 2 tabs x 2 days then 1 tab x 1 day 11/26/19   Marcine Matar, MD    Family History Family History  Problem Relation Age of Onset  . Cancer Mother     Social History Social History   Tobacco Use  . Smoking  status: Current Every Day Smoker    Packs/day: 0.50    Types: Cigarettes  . Smokeless tobacco: Never Used  . Tobacco comment: marijuana  Substance Use Topics  . Alcohol use: Yes    Alcohol/week: 7.0 standard drinks    Types: 7 Cans of beer per week  . Drug use: Yes    Types: Marijuana    Comment: 2 weeks ago last smoked     Allergies   Dilaudid [hydromorphone hcl]   Review of Systems Review of Systems   Physical Exam Triage Vital Signs ED Triage Vitals  Enc Vitals Group     BP 01/06/20 1715 (!) 155/87     Pulse Rate 01/06/20 1715 70     Resp 01/06/20 1715 16     Temp 01/06/20 1715 98.7 F (37.1 C)     Temp Source 01/06/20 1715 Oral     SpO2  01/06/20 1715 99 %     Weight --      Height --      Head Circumference --      Peak Flow --      Pain Score 01/06/20 1716 6     Pain Loc --      Pain Edu? --      Excl. in GC? --    No data found.  Updated Vital Signs BP (!) 155/87 (BP Location: Left Arm)   Pulse 70   Temp 98.7 F (37.1 C) (Oral)   Resp 16   SpO2 99%   Visual Acuity Right Eye Distance:   Left Eye Distance:   Bilateral Distance:    Right Eye Near:   Left Eye Near:    Bilateral Near:     Physical Exam Vitals and nursing note reviewed.  HENT:     Mouth/Throat:      Comments: Dentition fair.  No frank abscess, however gingival swelling around marked tooth.  There is mild facial swelling and tenderness over the left maxillary dentition. Oropharynx clear.  Controlling secretions.  No trismus.  No submandibular or submental swelling.     UC Treatments / Results  Labs (all labs ordered are listed, but only abnormal results are displayed) Labs Reviewed - No data to display  EKG   Radiology No results found.  Procedures Procedures (including critical care time)  Medications Ordered in UC Medications  ketorolac (TORADOL) 30 MG/ML injection 30 mg (30 mg Intramuscular Given 01/06/20 1742)    Initial Impression / Assessment and Plan / UC Course  I have reviewed the triage vital signs and the nursing notes.  Pertinent labs & imaging results that were available during my care of the patient were reviewed by me and considered in my medical decision making (see chart for details).     #Dental infection #Dental pain Patient is a 49 year old gentleman presenting with dental pain and infection.  Reassuring exam here.  Will start on Augmentin.  Patient has tolerated Vicodin in the past without reaction, short course prescribed.  Instruct him to follow-up with his dentist and primary care.  Return in emergency for precautions gust.  Patient verbalized understand plan of care Final Clinical Impressions(s)  / UC Diagnoses   Final diagnoses:  Pain, dental  Dental infection     Discharge Instructions     Take the augmentin as prescribed Take the vicodin for severe pain, do not drive, drink alcohol or operate machinery within 8 hours - if you develop a rash, stop take 2 benadryl and stop this medication  Follow up with your primary care and dentist  If severely worsening, high fever, return or go to the Emergency department      ED Prescriptions    Medication Sig Dispense Auth. Provider   amoxicillin-clavulanate (AUGMENTIN) 875-125 MG tablet Take 1 tablet by mouth 2 (two) times daily for 10 days. 20 tablet Tiffanny Lamarche, Veryl Speak, PA-C   HYDROcodone-acetaminophen (NORCO/VICODIN) 5-325 MG tablet Take 1-2 tablets by mouth every 4 (four) hours as needed. 6 tablet Torrance Stockley, Veryl Speak, PA-C     I have reviewed the PDMP during this encounter.   Hermelinda Medicus, PA-C 01/06/20 [phone]

## 2020-01-06 NOTE — ED Triage Notes (Signed)
Pt presents today with left side facial pain that he associates with a broken tooth x1 week. Left side of cheek feels hot to touch and swollen. States his left ear and neck hurts as well. Has not been able to eat much.

## 2020-01-07 MED FILL — AMOX-CLAV 875-125 MG TABLET: 875-125 | 10 days supply | Qty: 20 | Fill #0

## 2020-02-07 MED FILL — ALBUTEROL SUL 2.5 MG/3 ML S: (2.5 MG/3ML | 12 days supply | Qty: 150 | Fill #1

## 2020-02-27 ENCOUNTER — Other Ambulatory Visit: Payer: Self-pay

## 2020-02-27 ENCOUNTER — Encounter (HOSPITAL_COMMUNITY): Payer: Self-pay | Admitting: *Deleted

## 2020-02-27 ENCOUNTER — Other Ambulatory Visit (HOSPITAL_COMMUNITY): Payer: Self-pay | Admitting: Family Medicine

## 2020-02-27 ENCOUNTER — Ambulatory Visit (HOSPITAL_COMMUNITY)
Admission: EM | Admit: 2020-02-27 | Discharge: 2020-02-27 | Disposition: A | Discharge status: Home / Self Care | Payer: Self-pay | Attending: Family Medicine | Admitting: Family Medicine

## 2020-02-27 DIAGNOSIS — R030 Elevated blood-pressure reading, without diagnosis of hypertension: Secondary | ICD-10-CM

## 2020-02-27 DIAGNOSIS — J441 Chronic obstructive pulmonary disease with (acute) exacerbation: Secondary | ICD-10-CM

## 2020-02-27 DIAGNOSIS — R0602 Shortness of breath: Secondary | ICD-10-CM

## 2020-02-27 DIAGNOSIS — J449 Chronic obstructive pulmonary disease, unspecified: Secondary | ICD-10-CM

## 2020-02-27 HISTORY — DX: Essential (primary) hypertension: I10

## 2020-02-27 MED ORDER — PREDNISONE 10 MG PO TABS: ORAL_TABLET | ORAL | 0 refills | Status: DC

## 2020-02-27 MED ORDER — ALBUTEROL SULFATE HFA 108 (90 BASE) MCG/ACT IN AERS
INHALATION_SPRAY | RESPIRATORY_TRACT | Status: AC
  Filled 2020-02-27: qty 6.7

## 2020-02-27 MED ORDER — ALBUTEROL SULFATE HFA 108 (90 BASE) MCG/ACT IN AERS
2.0000 | INHALATION_SPRAY | Freq: Once | RESPIRATORY_TRACT | Status: AC
  Administered 2020-02-27: 2 via RESPIRATORY_TRACT

## 2020-02-27 MED ORDER — METHYLPREDNISOLONE SODIUM SUCC 125 MG IJ SOLR
INTRAMUSCULAR | Status: AC
  Filled 2020-02-27: qty 2

## 2020-02-27 MED ORDER — METHYLPREDNISOLONE SODIUM SUCC 125 MG IJ SOLR
60.0000 mg | Freq: Once | INTRAMUSCULAR | Status: AC
  Administered 2020-02-27: 60 mg via INTRAMUSCULAR

## 2020-02-27 MED ORDER — AZITHROMYCIN 250 MG PO TABS: ORAL_TABLET | ORAL | 0 refills | Status: DC

## 2020-02-27 MED ORDER — ALBUTEROL SULFATE HFA 108 (90 BASE) MCG/ACT IN AERS: 2.0000 | INHALATION_SPRAY | RESPIRATORY_TRACT | 0 refills | Status: DC | PRN

## 2020-02-27 MED ORDER — IPRATROPIUM-ALBUTEROL 0.5-2.5 (3) MG/3ML IN SOLN: 3.0000 mL | Freq: Four times a day (QID) | RESPIRATORY_TRACT | 0 refills | Status: DC | PRN

## 2020-02-27 NOTE — ED Provider Notes (Signed)
[facility]    CSN: [id number] Arrival date & time: 02/27/20  1736      History   Chief Complaint Chief Complaint  Patient presents with  . Shortness of Breath    HPI Jesse Walters is a 49 y.o. male.   Patient presenting today for 2 day hx of acute on chronic chest pain, SOB, wheezing. States he ran out of his inhalers for his severe COPD a week or two ago and couldn't get in with his PCP until middle of this week. He called EMS the day before yesterday who came, evaluated him and gave him a nebulizer treatment and steroid which helped so he declined transport to hospital. Denies fever, chills, sore throat, congestion, abdominal pain, N/V/D. Has been without insurance for quite some time so has only had albuterol inhalers here and there. No new sick contacts, recent travel.      Past Medical History:  Diagnosis Date  . COPD (chronic obstructive pulmonary disease) (HCC)   . COPD (chronic obstructive pulmonary disease) (HCC)   . Hypertension   . Seasonal allergic rhinitis     Patient Active Problem List   Diagnosis Date Noted  . Essential hypertension 11/26/2019  . Tobacco dependence 07/09/2019  . Acute on chronic respiratory failure with hypoxia (HCC) 07/02/2019  . Tobacco abuse 07/02/2019  . Chronic obstructive pulmonary disease (HCC) 07/02/2019  . Pain, dental 10/25/2013    Past Surgical History:  Procedure Laterality Date  . HERNIA REPAIR         Home Medications    Prior to Admission medications   Medication Sig Start Date End Date Taking? Authorizing Provider  amLODipine (NORVASC) 5 MG tablet Take 1 tablet (5 mg total) by mouth daily. 11/26/19  Yes Marcine Matar, MD  nicotine (NICODERM CQ - DOSED IN MG/24 HOURS) 14 mg/24hr patch Place 1 patch (14 mg total) onto the skin daily. 11/26/19  Yes Marcine Matar, MD  albuterol (PROVENTIL) (2.5 MG/3ML) 0.083% nebulizer solution Take 3 mLs (2.5 mg total) by nebulization every 6 (six) hours as  needed for wheezing or shortness of breath. 11/26/19   Marcine Matar, MD  albuterol (VENTOLIN HFA) 108 (90 Base) MCG/ACT inhaler Inhale 2 puffs into the lungs every 4 (four) hours as needed for wheezing or shortness of breath. 02/27/20   Particia Nearing, PA-C  azithromycin [facility]) 250 MG tablet Take 2 tabs day one, then 1 tab daily until complete 02/27/20   Particia Nearing, PA-C  budesonide-formoterol [facility]) 80-4.5 MCG/ACT inhaler Inhale 2 puffs into the lungs 2 (two) times daily. 11/26/19   Marcine Matar, MD  guaiFENesin (ROBITUSSIN) 100 MG/5ML SOLN Take 5 mLs (100 mg total) by mouth every 4 (four) hours as needed for cough or to loosen phlegm. 07/05/19   Joseph Art, DO  HYDROcodone-acetaminophen (NORCO/VICODIN) 5-325 MG tablet Take 1-2 tablets by mouth every 4 (four) hours as needed. 01/06/20   Darr, Gerilyn Pilgrim, PA-C  ibuprofen (ADVIL) 200 MG tablet Take 400 mg by mouth every 6 (six) hours as needed for mild pain (or muscle strains).    [provider]  ipratropium-albuterol (DUONEB) 0.5-2.5 (3) MG/3ML SOLN Take 3 mLs by nebulization every 6 (six) hours as needed. 02/27/20   Particia Nearing, PA-C  nicotine (NICODERM CQ - DOSED IN MG/24 HOURS) 21 mg/24hr patch Place 1 patch (21 mg total) onto the skin daily. 11/26/19   Marcine Matar, MD  predniSONE (DELTASONE) 10 MG tablet 3 tabs PO daily  x 2 days then 2 tabs x 2 days then 1 tab x 1 day 11/26/19   Marcine Matar, MD  predniSONE (DELTASONE) 10 MG tablet Take 6 tabs day one, 5 tabs day two, 4 tabs day three, etc 02/27/20   Particia Nearing, PA-C    Family History Family History  Problem Relation Age of Onset  . Cancer Mother     Social History Social History   Tobacco Use  . Smoking status: Current Every Day Smoker    Packs/day: 0.50    Types: Cigarettes  . Smokeless tobacco: Never Used  . Tobacco comment: trying to quit  Vaping Use  . Vaping Use: Never used  Substance Use Topics    . Alcohol use: Yes    Alcohol/week: 7.0 standard drinks    Types: 7 Cans of beer per week  . Drug use: Yes    Types: Marijuana     Allergies   Dilaudid [hydromorphone hcl]   Review of Systems Review of Systems PER HPI   Physical Exam Triage Vital Signs ED Triage Vitals  Enc Vitals Group     BP 02/27/20 1742 (!) 191/99     Pulse Rate 02/27/20 1742 100     Resp 02/27/20 1742 (!) 24     Temp 02/27/20 1742 98 F (36.7 C)     Temp Source 02/27/20 1742 Oral     SpO2 02/27/20 1742 95 %     Weight --      Height --      Head Circumference --      Peak Flow --      Pain Score 02/27/20 1743 6     Pain Loc --      Pain Edu? --      Excl. in GC? --    No data found.  Updated Vital Signs BP (!) 191/99   Pulse 100   Temp 98 F (36.7 C) (Oral)   Resp (!) 24   SpO2 95%   Visual Acuity Right Eye Distance:   Left Eye Distance:   Bilateral Distance:    Right Eye Near:   Left Eye Near:    Bilateral Near:     Physical Exam Vitals and nursing note reviewed.  Constitutional:      Appearance: Normal appearance.  HENT:     Head: Atraumatic.     Right Ear: Tympanic membrane normal.     Left Ear: Tympanic membrane normal.     Nose: Nose normal.     Mouth/Throat:     Mouth: Mucous membranes are moist.     Pharynx: Posterior oropharyngeal erythema present.  Eyes:     Extraocular Movements: Extraocular movements intact.     Conjunctiva/sclera: Conjunctivae normal.  Cardiovascular:     Rate and Rhythm: Normal rate and regular rhythm.     Heart sounds: Normal heart sounds.  Pulmonary:     Breath sounds: No rales.     Comments: When examined in triage, tachypneic, significantly wheezing, labored respirations, cannot speak in full sentences, mildly tripoding. After albuterol treatment when examined in exam room, moderate diffuse wheezes, non-labored respirations, speaking comfortably in full sentences  Abdominal:     General: Bowel sounds are normal. There is no  distension.     Palpations: Abdomen is soft.     Tenderness: There is no abdominal tenderness. There is no guarding.  Musculoskeletal:        General: Normal range of motion.     Cervical back:  Normal range of motion and neck supple.  Skin:    General: Skin is warm and dry.  Neurological:     General: No focal deficit present.     Mental Status: He is oriented to person, place, and time.  Psychiatric:        Mood and Affect: Mood normal.        Thought Content: Thought content normal.        Judgment: Judgment normal.    UC Treatments / Results  Labs (all labs ordered are listed, but only abnormal results are displayed) Labs Reviewed - No data to display  EKG   Radiology No results found.  Procedures Procedures (including critical care time)  Medications Ordered in UC Medications  albuterol (VENTOLIN HFA) 108 (90 Base) MCG/ACT inhaler 2 puff (2 puffs Inhalation Given 02/27/20 1754)  methylPREDNISolone sodium succinate (SOLU-MEDROL) 125 mg/2 mL injection 60 mg (60 mg Intramuscular Given 02/27/20 1846)    Initial Impression / Assessment and Plan / UC Course  I have reviewed the triage vital signs and the nursing notes.  Pertinent labs & imaging results that were available during my care of the patient were reviewed by me and considered in my medical decision making (see chart for details).     Significant improvement after albuterol treatment, will also give IM solumedrol in clinic and then send home with albuterol inhaler, duoneb solution, zpak, prednisone taper. He is scheduled with PCP in several days for recheck. Strict ED precautions given if worsening in meantime.   BP significantly elevated in clinic as well. F/u with PCP on this, DASH diet, stress reduction reviewed  Final Clinical Impressions(s) / UC Diagnoses   Final diagnoses:  COPD exacerbation (HCC)  SOB (shortness of breath)  Single episode of elevated blood pressure   Discharge Instructions    None    ED Prescriptions    Medication Sig Dispense Auth. Provider   albuterol (VENTOLIN HFA) 108 (90 Base) MCG/ACT inhaler Inhale 2 puffs into the lungs every 4 (four) hours as needed for wheezing or shortness of breath. 18 g Particia Nearing, [place]   ipratropium-albuterol (DUONEB) 0.5-2.5 (3) MG/3ML SOLN Take 3 mLs by nebulization every 6 (six) hours as needed. 360 mL Particia Nearing, PA-C   predniSONE (DELTASONE) 10 MG tablet Take 6 tabs day one, 5 tabs day two, 4 tabs day three, etc 21 tablet Particia Nearing, PA-C   azithromycin (ZITHROMAX) 250 MG tablet Take 2 tabs day one, then 1 tab daily until complete 6 tablet Particia Nearing, PA-C     PDMP not reviewed this encounter.   Roosvelt Maser [place], [place] 03/02/20 [phone]

## 2020-02-27 NOTE — ED Triage Notes (Signed)
C/O SOB onset 2 days ago; states ran out of COPD meds a couple weeks ago. Also c/o runny nose and congestion for few days.  Audible wheezing noted. C/O chest pain since wearing nicotine patch.

## 2020-02-28 MED FILL — AZITHROMYCIN 250 MG TABLET: 250 | 5 days supply | Qty: 6 | Fill #0

## 2020-02-28 MED FILL — !VENTOLIN HFA INHALER: 108 (90 BAS | 16 days supply | Qty: 18 | Fill #0

## 2020-02-28 MED FILL — IPRAT-ALBUT 0.5-3(2.5) MG/3: 0.5-2.5 (3) | 30 days supply | Qty: 360 | Fill #0

## 2020-02-28 MED FILL — predniSONE 10 MG TABS: 10 | 6 days supply | Qty: 21 | Fill #0

## 2020-02-29 ENCOUNTER — Ambulatory Visit: Payer: Self-pay | Admitting: Internal Medicine

## 2020-03-03 ENCOUNTER — Ambulatory Visit: Payer: Self-pay | Admitting: Internal Medicine

## 2020-03-07 ENCOUNTER — Ambulatory Visit: Payer: Self-pay | Admitting: Internal Medicine

## 2020-04-04 ENCOUNTER — Other Ambulatory Visit: Payer: Self-pay

## 2020-04-04 ENCOUNTER — Other Ambulatory Visit: Payer: Self-pay | Admitting: Family Medicine

## 2020-04-04 ENCOUNTER — Ambulatory Visit (HOSPITAL_COMMUNITY)
Admission: EM | Admit: 2020-04-04 | Discharge: 2020-04-04 | Disposition: A | Discharge status: Home / Self Care | Payer: BC Managed Care – PPO | Attending: Family Medicine | Admitting: Family Medicine

## 2020-04-04 ENCOUNTER — Other Ambulatory Visit: Payer: Self-pay | Admitting: Internal Medicine

## 2020-04-04 ENCOUNTER — Encounter (HOSPITAL_COMMUNITY): Payer: Self-pay | Admitting: *Deleted

## 2020-04-04 DIAGNOSIS — R0600 Dyspnea, unspecified: Secondary | ICD-10-CM | POA: Diagnosis not present

## 2020-04-04 DIAGNOSIS — Z760 Encounter for issue of repeat prescription: Secondary | ICD-10-CM

## 2020-04-04 DIAGNOSIS — R059 Cough, unspecified: Secondary | ICD-10-CM

## 2020-04-04 DIAGNOSIS — J449 Chronic obstructive pulmonary disease, unspecified: Secondary | ICD-10-CM

## 2020-04-04 DIAGNOSIS — R0609 Other forms of dyspnea: Secondary | ICD-10-CM

## 2020-04-04 DIAGNOSIS — J441 Chronic obstructive pulmonary disease with (acute) exacerbation: Secondary | ICD-10-CM

## 2020-04-04 DIAGNOSIS — R0602 Shortness of breath: Secondary | ICD-10-CM

## 2020-04-04 MED ORDER — ALBUTEROL SULFATE HFA 108 (90 BASE) MCG/ACT IN AERS
INHALATION_SPRAY | RESPIRATORY_TRACT | Status: AC
  Filled 2020-04-04: qty 6.7

## 2020-04-04 MED ORDER — ALBUTEROL SULFATE (2.5 MG/3ML) 0.083% IN NEBU: 2.5000 mg | INHALATION_SOLUTION | Freq: Four times a day (QID) | RESPIRATORY_TRACT | 12 refills | Status: DC | PRN

## 2020-04-04 MED ORDER — FLUTICASONE-SALMETEROL 250-50 MCG/DOSE IN AEPB: 1.0000 | INHALATION_SPRAY | Freq: Two times a day (BID) | RESPIRATORY_TRACT | 0 refills | Status: DC

## 2020-04-04 MED ORDER — PREDNISONE 10 MG (21) PO TBPK: ORAL_TABLET | Freq: Every day | ORAL | 0 refills | Status: AC

## 2020-04-04 MED ORDER — METHYLPREDNISOLONE SODIUM SUCC 125 MG IJ SOLR
125.0000 mg | Freq: Once | INTRAMUSCULAR | Status: AC
  Administered 2020-04-04: 125 mg via INTRAMUSCULAR

## 2020-04-04 MED ORDER — ALBUTEROL SULFATE HFA 108 (90 BASE) MCG/ACT IN AERS
2.0000 | INHALATION_SPRAY | Freq: Once | RESPIRATORY_TRACT | Status: AC
  Administered 2020-04-04: 2 via RESPIRATORY_TRACT

## 2020-04-04 MED ORDER — METHYLPREDNISOLONE SODIUM SUCC 125 MG IJ SOLR
INTRAMUSCULAR | Status: AC
  Filled 2020-04-04: qty 2

## 2020-04-04 NOTE — ED Triage Notes (Signed)
Pt has had SHOB since Sunday and ran out of his meds. Pt unable to get in to PCP office today and was referred to [facility] for treatment. Pt requesting Albuterol HHN. Pt is also out of nebulizer meds.

## 2020-04-04 NOTE — Telephone Encounter (Signed)
   Notes to clinic:  Patient has appointment tomorrow  Review for refill    Requested Prescriptions  Pending Prescriptions Disp Refills   albuterol (PROVENTIL) (2.5 MG/3ML) 0.083% nebulizer solution [Pharmacy Med Name: ALBUTEROL SUL 2.5 MG/3 ML S (2.5 MG/3ML Nebulization Solution] 150 mL 1    Sig: TAKE 3 MLS (2.5 MG TOTAL) BY NEBULIZATION EVERY 6 (SIX) HOURS AS NEEDED FOR WHEEZING OR SHORTNESS OF BREATH.      Pulmonology:  Beta Agonists Failed - 04/04/2020  1:47 PM      Failed - One inhaler should last at least one month. If the patient is requesting refills earlier, contact the patient to check for uncontrolled symptoms.      Passed - Valid encounter within last 12 months    Recent Outpatient Visits           4 months ago Chronic obstructive pulmonary disease, unspecified COPD type [facility])   [facility] Marcine Matar, MD   9 months ago Hospital discharge follow-up   [facility] Marcine Matar, MD       Future Appointments             Tomorrow [place], [place] [facility]

## 2020-04-04 NOTE — ED Provider Notes (Signed)
Jesse Walters    CSN: [id number] Arrival date & time: 04/04/20  1407      History   Chief Complaint Chief Complaint  Patient presents with  . Shortness of Breath  . COPD    HPI Jesse Walters is a 49 y.o. male.   Reports that he has a history of COPD and that he has been having SOB for the last 2 days. States that he is coughing up his normal amount of white sputum in the morning. States that he is out of his medication. Reports SOB and coughing is worse with activity. Has not attempted OTC treatment at home. Is a current daily smoker. Denies headache, nausea, vomiting, diarrhea, rash, fever, other symptoms. Reports that he tried to get in with PCP and he was directed to urgent care to be evaluated.  ROS per HPI  The history is provided by the patient.    Past Medical History:  Diagnosis Date  . COPD (chronic obstructive pulmonary disease) (HCC)   . COPD (chronic obstructive pulmonary disease) (HCC)   . Hypertension   . Seasonal allergic rhinitis     Patient Active Problem List   Diagnosis Date Noted  . Essential hypertension 11/26/2019  . Tobacco dependence 07/09/2019  . Acute on chronic respiratory failure with hypoxia (HCC) 07/02/2019  . Tobacco abuse 07/02/2019  . Chronic obstructive pulmonary disease (HCC) 07/02/2019  . Pain, dental 10/25/2013    Past Surgical History:  Procedure Laterality Date  . HERNIA REPAIR         Home Medications    Prior to Admission medications   Medication Sig Start Date End Date Taking? Authorizing Provider  amLODipine (NORVASC) 5 MG tablet Take 1 tablet (5 mg total) by mouth daily. 11/26/19  Yes Marcine Matar, MD  albuterol (PROVENTIL) (2.5 MG/3ML) 0.083% nebulizer solution Take 3 mLs (2.5 mg total) by nebulization every 6 (six) hours as needed for wheezing or shortness of breath. 04/04/20   Moshe Cipro, NP  albuterol (VENTOLIN HFA) 108 (90 Base) MCG/ACT inhaler Inhale 2 puffs into the lungs every 4 (four)  hours as needed for wheezing or shortness of breath. 02/27/20   Particia Nearing, PA-C  azithromycin [facility]) 250 MG tablet Take 2 tabs day one, then 1 tab daily until complete 02/27/20   Particia Nearing, PA-C  Fluticasone-Salmeterol (ADVAIR DISKUS) 250-50 MCG/DOSE AEPB Inhale 1 puff into the lungs 2 (two) times daily. 04/04/20   Moshe Cipro, NP  guaiFENesin (ROBITUSSIN) 100 MG/5ML SOLN Take 5 mLs (100 mg total) by mouth every 4 (four) hours as needed for cough or to loosen phlegm. 07/05/19   Joseph Art, DO  HYDROcodone-acetaminophen (NORCO/VICODIN) 5-325 MG tablet Take 1-2 tablets by mouth every 4 (four) hours as needed. 01/06/20   Darr, Gerilyn Pilgrim, PA-C  ibuprofen (ADVIL) 200 MG tablet Take 400 mg by mouth every 6 (six) hours as needed for mild pain (or muscle strains).    [provider]  ipratropium-albuterol (DUONEB) 0.5-2.5 (3) MG/3ML SOLN Take 3 mLs by nebulization every 6 (six) hours as needed. 02/27/20   Particia Nearing, PA-C  nicotine (NICODERM CQ - DOSED IN MG/24 HOURS) 14 mg/24hr patch Place 1 patch (14 mg total) onto the skin daily. 11/26/19   Marcine Matar, MD  nicotine (NICODERM CQ - DOSED IN MG/24 HOURS) 21 mg/24hr patch Place 1 patch (21 mg total) onto the skin daily. 11/26/19   Marcine Matar, MD  predniSONE (STERAPRED UNI-PAK 21 TAB) 10 MG (21)  TBPK tablet Take by mouth daily for 6 days. Take 6 tablets on day 1, 5 tablets on day 2, 4 tablets on day 3, 3 tablets on day 4, 2 tablets on day 5, 1 tablet on day 6 04/04/20 04/10/20  Moshe Cipro, NP    Family History Family History  Problem Relation Age of Onset  . Cancer Mother     Social History Social History   Tobacco Use  . Smoking status: Current Every Day Smoker    Packs/day: 0.50    Types: Cigarettes  . Smokeless tobacco: Never Used  . Tobacco comment: trying to quit  Vaping Use  . Vaping Use: Never used  Substance Use Topics  . Alcohol use: Yes    Alcohol/week: 7.0  standard drinks    Types: 7 Cans of beer per week  . Drug use: Yes    Types: Marijuana     Allergies   Dilaudid [hydromorphone hcl]   Review of Systems Review of Systems   Physical Exam Triage Vital Signs ED Triage Vitals  Enc Vitals Group     BP 04/04/20 1509 112/69     Pulse Rate 04/04/20 1509 (!) 14     Resp 04/04/20 1509 (!) 23     Temp 04/04/20 1509 98.5 F (36.9 C)     Temp Source 04/04/20 1509 Oral     SpO2 04/04/20 1509 100 %     Weight 04/04/20 1512 155 lb (70.3 kg)     Height 04/04/20 1512 5\' 10"  (1.778 m)     Head Circumference --      Peak Flow --      Pain Score 04/04/20 1511 0     Pain Loc --      Pain Edu? --      Excl. in GC? --    No data found.  Updated Vital Signs BP 112/69 (BP Location: Right Arm)   Pulse (!) 14   Temp 98.5 F (36.9 C) (Oral)   Resp (!) 23   Ht 5\' 10"  (1.778 m)   Wt 155 lb (70.3 kg)   SpO2 100%   BMI 22.24 kg/m      Physical Exam Vitals and nursing note reviewed.  Constitutional:      General: He is not in acute distress.    Appearance: He is well-developed. He is not ill-appearing.     Interventions: He is not intubated. HENT:     Head: Normocephalic and atraumatic.     Mouth/Throat:     Mouth: Mucous membranes are moist.     Pharynx: Oropharynx is clear.  Eyes:     Conjunctiva/sclera: Conjunctivae normal.     Pupils: Pupils are equal, round, and reactive to light.  Cardiovascular:     Rate and Rhythm: Normal rate and regular rhythm.     Heart sounds: No murmur heard.   Pulmonary:     Effort: Pulmonary effort is normal. Tachypnea present. No bradypnea, accessory muscle usage or respiratory distress. He is not intubated.     Breath sounds: No stridor. Examination of the right-upper field reveals wheezing. Examination of the left-upper field reveals wheezing. Examination of the right-middle field reveals wheezing. Examination of the left-middle field reveals wheezing. Examination of the right-lower field  reveals decreased breath sounds. Examination of the left-lower field reveals decreased breath sounds. Decreased breath sounds and wheezing present. No rhonchi or rales.  Chest:     Chest wall: No mass, deformity, tenderness, crepitus or edema. There is no dullness  to percussion.  Abdominal:     Palpations: Abdomen is soft.     Tenderness: There is no abdominal tenderness.  Musculoskeletal:        General: Normal range of motion.     Cervical back: Normal range of motion and neck supple.  Skin:    General: Skin is warm and dry.     Capillary Refill: Capillary refill takes less than 2 seconds.  Neurological:     General: No focal deficit present.     Mental Status: He is alert and oriented to person, place, and time.  Psychiatric:        Mood and Affect: Mood normal.        Behavior: Behavior normal.      UC Treatments / Results  Labs (all labs ordered are listed, but only abnormal results are displayed) Labs Reviewed - No data to display  EKG   Radiology No results found.  Procedures Procedures (including critical care time)  Medications Ordered in UC Medications  albuterol (VENTOLIN HFA) 108 (90 Base) MCG/ACT inhaler 2 puff (2 puffs Inhalation Given 04/04/20 1551)  methylPREDNISolone sodium succinate (SOLU-MEDROL) 125 mg/2 mL injection 125 mg (125 mg Intramuscular Given 04/04/20 1551)    Initial Impression / Assessment and Plan / UC Course  I have reviewed the triage vital signs and the nursing notes.  Pertinent labs & imaging results that were available during my care of the patient were reviewed by me and considered in my medical decision making (see chart for details).     COPD exacerbation Dyspnea on exertion SOB Cough Med Refill  Presents with SOB, cough after running out of medications for COPD Albuterol inhaler given in office today Solumedrol 125mg  IM in office  Refilled neb solution Refilled advair Prescribed steroid taper Counseled to follow up  with PCP as scheduled Follow up in the ER if symptoms persist despite treatment today, or if symptoms worsen  Final Clinical Impressions(s) / UC Diagnoses   Final diagnoses:  Medication refill  Dyspnea on exertion  Shortness of breath  COPD exacerbation (HCC)  Cough     Discharge Instructions     You have received a steroid injection in the office today  You have received an albuterol inhaler in the office today  I have refilled your nebulizer solution  I have sent in Advair for you to use one puff twice a day   I have sent in a prednisone taper for you to take for 6 days. 6 tablets on day one, 5 tablets on day two, 4 tablets on day three, 3 tablets on day four, 2 tablets on day five, and 1 tablet on day six.  Follow up with this office or with primary care if symptoms are persisting.  Follow up in the ER for high fever, trouble swallowing, trouble breathing, other concerning symptoms.     ED Prescriptions    Medication Sig Dispense Auth. Provider   albuterol (PROVENTIL) (2.5 MG/3ML) 0.083% nebulizer solution Take 3 mLs (2.5 mg total) by nebulization every 6 (six) hours as needed for wheezing or shortness of breath. 75 mL , NP   Fluticasone-Salmeterol (ADVAIR DISKUS) 250-50 MCG/DOSE AEPB Inhale 1 puff into the lungs 2 (two) times daily. 60 each Moshe Cipro, NP   predniSONE (STERAPRED UNI-PAK 21 TAB) 10 MG (21) TBPK tablet Take by mouth daily for 6 days. Take 6 tablets on day 1, 5 tablets on day 2, 4 tablets on day 3, 3 tablets on day 4,  2 tablets on day 5, 1 tablet on day 6 21 tablet Moshe CiproMatthews, Edoardo Laforte, NP     PDMP not reviewed this encounter.   Moshe CiproMatthews, Gerardo Territo, NP 04/05/20 1029

## 2020-04-04 NOTE — Telephone Encounter (Signed)
Requested medication (s) are due for refill today: Yes  Requested medication (s) are on the active medication list: Yes  Last refill:  02/28/20  Future visit scheduled: Yes  Notes to clinic:  Unable to refill per protocol, last refill by another provider     Requested Prescriptions  Pending Prescriptions Disp Refills   ipratropium-albuterol (DUONEB) 0.5-2.5 (3) MG/3ML SOLN [Pharmacy Med Name: IPRAT-ALBUT 0.5-3(2.5) MG/3 0.5-2.5 (3) Solution] 360 mL 0    Sig: Take 3 mLs by nebulization every 6 (six) hours as needed.      Pulmonology:  Combination Products Passed - 04/04/2020  1:47 PM      Passed - Valid encounter within last 12 months    Recent Outpatient Visits           4 months ago Chronic obstructive pulmonary disease, unspecified COPD type [facility])   [facility] Marcine Matar, MD   9 months ago Hospital discharge follow-up   [facility] Marcine Matar, MD       Future Appointments             Tomorrow Mayers, Nickola Major [facility]

## 2020-04-04 NOTE — Discharge Instructions (Signed)
You have received a steroid injection in the office today  You have received an albuterol inhaler in the office today  I have refilled your nebulizer solution  I have sent in Advair for you to use one puff twice a day   I have sent in a prednisone taper for you to take for 6 days. 6 tablets on day one, 5 tablets on day two, 4 tablets on day three, 3 tablets on day four, 2 tablets on day five, and 1 tablet on day six.  Follow up with this office or with primary care if symptoms are persisting.  Follow up in the ER for high fever, trouble swallowing, trouble breathing, other concerning symptoms.

## 2020-04-04 NOTE — ED Notes (Signed)
Pt called on cell phone and was told room was ready at [facility] . Pt hung up on staff.

## 2020-04-04 NOTE — ED Notes (Signed)
Pt called from front lobby no answer. 

## 2020-04-05 ENCOUNTER — Ambulatory Visit: Payer: BC Managed Care – PPO | Admitting: Physician Assistant

## 2020-04-05 MED FILL — predniSONE 10 MG TABS: 10 | 6 days supply | Qty: 21 | Fill #0

## 2020-04-05 MED FILL — ALBUTEROL 0.083% INHAL SOLN: (2.5 MG/3ML | 7 days supply | Qty: 75 | Fill #0

## 2020-04-05 MED FILL — VENTOLIN HFA 90 MCG INHALER: 108 (90 BAS | 16 days supply | Qty: 18 | Fill #1

## 2020-04-05 MED FILL — ADVAIR 250/50 DISKUS: 250-50 | 30 days supply | Qty: 60 | Fill #0

## 2020-04-06 MED FILL — IPRAT-ALBUT 0.5-3(2.5) MG/3: 0.5-2.5 (3) | 30 days supply | Qty: 360 | Fill #0

## 2020-06-12 ENCOUNTER — Other Ambulatory Visit: Payer: Self-pay

## 2020-06-12 ENCOUNTER — Other Ambulatory Visit: Payer: Self-pay | Admitting: Internal Medicine

## 2020-06-12 DIAGNOSIS — J441 Chronic obstructive pulmonary disease with (acute) exacerbation: Secondary | ICD-10-CM

## 2020-06-12 DIAGNOSIS — J449 Chronic obstructive pulmonary disease, unspecified: Secondary | ICD-10-CM

## 2020-06-12 MED ORDER — ALBUTEROL SULFATE HFA 108 (90 BASE) MCG/ACT IN AERS: 2.0000 | INHALATION_SPRAY | RESPIRATORY_TRACT | 0 refills | Status: DC | PRN

## 2020-06-12 MED FILL — VENTOLIN HFA 90 MCG INHALER: 108 (90 BAS | 25 days supply | Qty: 18 | Fill #0

## 2020-06-12 NOTE — Telephone Encounter (Signed)
  Notes to clinic:  medication prescribed at [facility] Review for refill    Requested Prescriptions  Pending Prescriptions Disp Refills   ADVAIR DISKUS 250-50 MCG/DOSE AEPB [Pharmacy Med Name: ADVAIR 250/50 DISKUS 250-50 Aerosol] 60 each 0    Sig: INHALE 1 PUFF INTO THE LUNGS 2 (TWO) TIMES DAILY.      Pulmonology:  Combination Products Passed - 06/12/2020  4:13 PM      Passed - Valid encounter within last 12 months    Recent Outpatient Visits           6 months ago Chronic obstructive pulmonary disease, unspecified COPD type [facility])   [facility] Marcine Matar, MD   11 months ago Hospital discharge follow-up   [facility] Marcine Matar, MD

## 2020-06-13 ENCOUNTER — Other Ambulatory Visit: Payer: Self-pay | Admitting: Internal Medicine

## 2020-06-13 MED FILL — ADVAIR 250/50 DISKUS: 250-50 | 30 days supply | Qty: 60 | Fill #0

## 2020-06-21 MED FILL — ADVAIR 250/50 DISKUS: 250-50 | 30 days supply | Qty: 60 | Fill #0

## 2020-06-26 ENCOUNTER — Ambulatory Visit: Payer: BC Managed Care – PPO | Attending: Internal Medicine | Admitting: Internal Medicine

## 2020-06-26 ENCOUNTER — Encounter: Payer: Self-pay | Admitting: Internal Medicine

## 2020-06-26 ENCOUNTER — Other Ambulatory Visit: Payer: Self-pay

## 2020-06-26 ENCOUNTER — Other Ambulatory Visit: Payer: Self-pay | Admitting: Internal Medicine

## 2020-06-26 VITALS — BP 150/95 | HR 79 | Resp 16 | Wt 160.8 lb

## 2020-06-26 DIAGNOSIS — J449 Chronic obstructive pulmonary disease, unspecified: Secondary | ICD-10-CM | POA: Diagnosis not present

## 2020-06-26 DIAGNOSIS — Z2821 Immunization not carried out because of patient refusal: Secondary | ICD-10-CM | POA: Diagnosis not present

## 2020-06-26 DIAGNOSIS — F17200 Nicotine dependence, unspecified, uncomplicated: Secondary | ICD-10-CM

## 2020-06-26 DIAGNOSIS — Z1211 Encounter for screening for malignant neoplasm of colon: Secondary | ICD-10-CM

## 2020-06-26 DIAGNOSIS — I10 Essential (primary) hypertension: Secondary | ICD-10-CM

## 2020-06-26 MED ORDER — ALBUTEROL SULFATE (2.5 MG/3ML) 0.083% IN NEBU: 2.5000 mg | INHALATION_SOLUTION | Freq: Four times a day (QID) | RESPIRATORY_TRACT | 12 refills | Status: DC | PRN

## 2020-06-26 MED ORDER — FLUTICASONE-SALMETEROL 250-50 MCG/DOSE IN AEPB: 1.0000 | INHALATION_SPRAY | Freq: Two times a day (BID) | RESPIRATORY_TRACT | 6 refills | Status: DC

## 2020-06-26 MED ORDER — AMLODIPINE BESYLATE 5 MG PO TABS: 5.0000 mg | ORAL_TABLET | Freq: Every day | ORAL | 1 refills | Status: DC

## 2020-06-26 MED ORDER — NICOTINE POLACRILEX 2 MG MT GUM: 2.0000 mg | CHEWING_GUM | OROMUCOSAL | 3 refills | Status: DC | PRN

## 2020-06-26 MED ORDER — ALBUTEROL SULFATE HFA 108 (90 BASE) MCG/ACT IN AERS: 2.0000 | INHALATION_SPRAY | RESPIRATORY_TRACT | 6 refills | Status: DC | PRN

## 2020-06-26 NOTE — Progress Notes (Signed)
Patient ID: Jesse Walters, male    DOB: [birth year]  MRN: [id number]  CC: Diabetes, COPD, and Medication Refill   Subjective: Jesse Walters is a 50 y.o. male who presents for chronic disease management.  Last seen 11/2019. His concerns today include:  Patient with history of COPD, HTN and tobacco dependence  HTN: On last visit patient was started on amlodipine. He did fill rxn but out for 3 mths.  States due to work schedule, he can not get here every mth to the pharmacy to pick up medicine. No CP, increase SOB, HA/dizziness, LE edema  COPD: using Advair 1-2 x a day.  When out of Advair, he has to use Albuterol MDI more.  He was out Advair x 1 mth.  RF 1 wk ago.  Reports that his breathing has stabilized now that he is back on the Advair. -reports he has cut back to 5 cigarettes a day. Previously smoking over 1 pk/day.  Plans to quit.  Tried nicotine patches in the past but states it causes CP.  He would like to try the gum instead.  HM:  Declines COVID vaccine.  Still has FIT test at home that was given to him last year.  He promised to use it and turn it in..  Patient Active Problem List   Diagnosis Date Noted  . Influenza vaccine refused 06/26/2020  . Essential hypertension 11/26/2019  . Tobacco dependence 07/09/2019  . Tobacco abuse 07/02/2019  . Chronic obstructive pulmonary disease (HCC) 07/02/2019  . Pain, dental 10/25/2013     Current Outpatient Medications on File Prior to Visit  Medication Sig Dispense Refill  . ipratropium-albuterol (DUONEB) 0.5-2.5 (3) MG/3ML SOLN TAKE 3 MLS BY NEBULIZATION EVERY 6 (SIX) HOURS AS NEEDED. 360 mL 0   No current facility-administered medications on file prior to visit.    Allergies  Allergen Reactions  . Dilaudid [Hydromorphone Hcl] Hives    Social History   Socioeconomic History  . Marital status: Single    Spouse name: Not on file  . Number of children: Not on file  . Years of education: Not on file  . Highest education level:  Not on file  Occupational History  . Not on file  Tobacco Use  . Smoking status: Current Every Day Smoker    Packs/day: 0.50    Types: Cigarettes  . Smokeless tobacco: Never Used  . Tobacco comment: trying to quit  Vaping Use  . Vaping Use: Never used  Substance and Sexual Activity  . Alcohol use: Yes    Alcohol/week: 7.0 standard drinks    Types: 7 Cans of beer per week  . Drug use: Yes    Types: Marijuana  . Sexual activity: Not on file  Other Topics Concern  . Not on file  Social History Narrative  . Not on file   Social Determinants of Health   Financial Resource Strain: Not on file  Food Insecurity: Not on file  Transportation Needs: Not on file  Physical Activity: Not on file  Stress: Not on file  Social Connections: Not on file  Intimate Partner Violence: Not on file    Family History  Problem Relation Age of Onset  . Cancer Mother     Past Surgical History:  Procedure Laterality Date  . HERNIA REPAIR      ROS: Review of Systems Negative except as stated above  PHYSICAL EXAM: BP (!) 150/95   Pulse 79   Resp 16   Wt 160 lb  12.8 oz (72.9 kg)   SpO2 99%   BMI 23.07 kg/m   Wt Readings from Last 3 Encounters:  06/26/20 160 lb 12.8 oz (72.9 kg)  04/04/20 155 lb (70.3 kg)  11/26/19 165 lb (74.8 kg)    Physical Exam General appearance - alert, well appearing, middle-aged African-American male and in no distress Mental status - normal mood, behavior, speech, dress, motor activity, and thought processes Neck - supple, no significant adenopathy Chest -breath sounds slightly decreased bilaterally.  No wheezes or crackles or rhonchi is heard. Heart - normal rate, regular rhythm, normal S1, S2, no murmurs, rubs, clicks or gallops Extremities -no lower extremity edema.  CMP Latest Ref Rng & Units 11/22/2019 10/16/2019 07/03/2019  Glucose 70 - 99 mg/dL 235(T) 614(E) 315(Q)  BUN 6 - 20 mg/dL 11 14 13   Creatinine 0.61 - 1.24 mg/dL 0.08 6.76  Sodium 135  - 145 mmol/L 137 138 135  Potassium 3.5 - 5.1 mmol/L 4.2 3.8 4.1  Chloride 98 - 111 mmol/L 107 107 107  CO2 22 - 32 mmol/L 20(L) 22 20(L)  Calcium 8.9 - 10.3 mg/dL 9.0 1.95) 0.9(T)  Total Protein 6.5 - 8.1 g/dL - 6.5 6.0(L)  Total Bilirubin 0.3 - 1.2 mg/dL - 1.7(H) 1.3(H)  Alkaline Phos 38 - 126 U/L - 57 58  AST 15 - 41 U/L - 23 16  ALT 0 - 44 U/L - 14 18   Lipid Panel  No results found for: CHOL, TRIG, HDL, CHOLHDL, VLDL, LDLCALC, LDLDIRECT  CBC    Component Value Date/Time   WBC 5.4 11/22/2019 0919   RBC 4.86 11/22/2019 0919   HGB 14.9 11/22/2019 0919   HCT 44.6 11/22/2019 0919   PLT 320 11/22/2019 0919   MCV 91.8 11/22/2019 0919   MCH 30.7 11/22/2019 0919   MCHC 33.4 11/22/2019 0919   RDW 13.0 11/22/2019 0919   LYMPHSABS 3.1 10/16/2019 0104   MONOABS 0.8 10/16/2019 0104   EOSABS 0.5 10/16/2019 0104   BASOSABS 0.1 10/16/2019 0104    ASSESSMENT AND PLAN: 1. Essential hypertension Not at goal.  He has been out of medication for 3 months.  He is agreeable to having prescription sent for 57-month supply at a time.  DASH diet discussed and encourage - amLODipine (NORVASC) 5 MG tablet; Take 1 tablet (5 mg total) by mouth daily.  Dispense: 90 tablet; Refill: 1 - Hepatic Function Panel - Lipid panel  2. Tobacco dependence Patient advised to quit smoking. Discussed health risks associated with smoking including lung and other types of cancers, chronic lung diseases and CV risks.. Pt ready to give trail of quitting.  Discussed methods to help quit including quitting cold 2-month, use of NRT, Chantix and Bupropion.  Patient wants to try the nicotine gum.  Encouraged him to set a quit date.  Less than 5 minutes spent on counseling. - nicotine polacrilex (NICORETTE) 2 MG gum; Take 1 each (2 mg total) by mouth as needed for smoking cessation.  Dispense: 100 tablet; Refill: 3  3. Chronic obstructive pulmonary disease, unspecified COPD type (HCC) Stable as long as he is on the Advair.   Refill given.  Strongly advised to quit smoking. - Fluticasone-Salmeterol (ADVAIR DISKUS) 250-50 MCG/DOSE AEPB; Inhale 1 puff into the lungs 2 (two) times daily.  Dispense: 60 each; Refill: 6 - albuterol (PROVENTIL) (2.5 MG/3ML) 0.083% nebulizer solution; Take 3 mLs (2.5 mg total) by nebulization every 6 (six) hours as needed for wheezing or shortness of breath.  Dispense: 75  mL; Refill: 12 - albuterol (VENTOLIN HFA) 108 (90 Base) MCG/ACT inhaler; Inhale 2 puffs into the lungs every 4 (four) hours as needed for wheezing or shortness of breath.  Dispense: 18 g; Refill: 6  4. Influenza vaccine refused Commended.  Patient declined.  5. COVID-19 vaccination declined   6. Screening for colon cancer Encouraged him to turn in the fit test.  He promises to do so.   Patient was given the opportunity to ask questions.  Patient verbalized understanding of the plan and was able to repeat key elements of the plan.   Orders Placed This Encounter  Procedures  . Hepatic Function Panel  . Lipid panel     Requested Prescriptions   Signed Prescriptions Disp Refills  . amLODipine (NORVASC) 5 MG tablet 90 tablet 1    Sig: Take 1 tablet (5 mg total) by mouth daily.  . Fluticasone-Salmeterol (ADVAIR DISKUS) 250-50 MCG/DOSE AEPB 60 each 6    Sig: Inhale 1 puff into the lungs 2 (two) times daily.  Marland Kitchen albuterol (PROVENTIL) (2.5 MG/3ML) 0.083% nebulizer solution 75 mL 12    Sig: Take 3 mLs (2.5 mg total) by nebulization every 6 (six) hours as needed for wheezing or shortness of breath.  Marland Kitchen albuterol (VENTOLIN HFA) 108 (90 Base) MCG/ACT inhaler 18 g 6    Sig: Inhale 2 puffs into the lungs every 4 (four) hours as needed for wheezing or shortness of breath.  . nicotine polacrilex (NICORETTE) 2 MG gum 100 tablet 3    Sig: Take 1 each (2 mg total) by mouth as needed for smoking cessation.    Return in about 4 months (around 10/24/2020) for Give appt with [facility] in 2 wks for recheck BP.  Pt prefers appt at or after  4 p.m.  Jonah Blue, MD, FACP

## 2020-06-26 NOTE — Patient Instructions (Signed)
Please remember to use this and turn in the stool test that we gave you last year for colon cancer screening.

## 2020-06-27 ENCOUNTER — Other Ambulatory Visit: Payer: Self-pay | Admitting: Internal Medicine

## 2020-06-27 DIAGNOSIS — E782 Mixed hyperlipidemia: Secondary | ICD-10-CM | POA: Insufficient documentation

## 2020-06-27 LAB — HEPATIC FUNCTION PANEL
ALT: 15 IU/L (ref 0–44)
AST: 20 IU/L (ref 0–40)
Albumin: 4.1 g/dL (ref 3.8–4.9)
Alkaline Phosphatase: 74 IU/L (ref 44–121)
Bilirubin Total: 0.7 mg/dL (ref 0.0–1.2)
Bilirubin, Direct: 0.13 mg/dL (ref 0.00–0.40)
Total Protein: 6.7 g/dL (ref 6.0–8.5)

## 2020-06-27 LAB — LIPID PANEL
Chol/HDL Ratio: 3.7 ratio (ref 0.0–5.0)
Cholesterol, Total: 212 mg/dL — ABNORMAL HIGH (ref 100–199)
HDL: 58 mg/dL (ref >39)
LDL Chol Calc (NIH): 139 mg/dL — ABNORMAL HIGH (ref 0–99)
Triglycerides: 87 mg/dL (ref 0–149)
VLDL Cholesterol Cal: 15 mg/dL (ref 5–40)

## 2020-06-27 MED ORDER — ATORVASTATIN CALCIUM 10 MG PO TABS: 10.0000 mg | ORAL_TABLET | Freq: Every day | ORAL | 1 refills | Status: DC

## 2020-06-27 NOTE — Progress Notes (Signed)
Let patient know that his liver function tests normal.  LDL cholesterol is 139 with goal being less than 100.  High cholesterol increases his risk for heart attack and strokes.  I recommend starting a cholesterol-lowering medication called atorvastatin to take once daily to help lower the cholesterol.  Prescription sent to his pharmacy. Rest of this is for my information. The 10-year ASCVD risk score Denman George DC Montez Hageman., et al., 2013) is: 19.7%   Values used to calculate the score:     Age: 50 years     Sex: Male     Is Non-Hispanic African American: Yes     Diabetic: No     Tobacco smoker: Yes     Systolic Blood Pressure: 150 mmHg     Is BP treated: Yes     HDL Cholesterol: 58 mg/dL     Total Cholesterol: 212 mg/dL

## 2020-06-28 MED FILL — ATORVASTATIN 10 MG TABLET: 10 | 90 days supply | Qty: 90 | Fill #0

## 2020-06-29 ENCOUNTER — Telehealth: Payer: Self-pay

## 2020-06-29 NOTE — Telephone Encounter (Signed)
Contacted pt to go over lab results pt is aware and doesn't have any questions or concerns 

## 2020-07-11 ENCOUNTER — Other Ambulatory Visit: Payer: Self-pay

## 2020-07-11 ENCOUNTER — Encounter: Payer: Self-pay | Admitting: Pharmacist

## 2020-07-11 ENCOUNTER — Ambulatory Visit: Payer: BC Managed Care – PPO | Attending: Internal Medicine | Admitting: Pharmacist

## 2020-07-11 VITALS — BP 160/97 | HR 73

## 2020-07-11 DIAGNOSIS — I10 Essential (primary) hypertension: Secondary | ICD-10-CM | POA: Diagnosis not present

## 2020-07-11 MED FILL — ATORVASTATIN 10 MG TABLET: 10 | 90 days supply | Qty: 90 | Fill #0

## 2020-07-11 MED FILL — ALBUTEROL 0.083% INHAL SOLN: (2.5 MG/3ML | 7 days supply | Qty: 75 | Fill #0

## 2020-07-11 MED FILL — AMLODIPINE BESYLATE 5 MG TA: 5 | 90 days supply | Qty: 90 | Fill #0

## 2020-07-11 NOTE — Progress Notes (Signed)
   S:    PCP: Dr. Laural Benes  Patient arrives well and in good spirits.  Presents to the clinic for hypertension evaluation, counseling, and management. Patient was referred and last seen by Primary Care Provider, Dr. Laural Benes, on 06/26/2020. At this visit, patient reported being out of medications x 3 months. Amlodipine 5 mg was restarted.   Today, patient reports he has not yet picked up amlodipine from the pharmacy. He has a difficult time getting to the pharmacy due to busy work schedule. I offered the patient the opportunity to use our mail-order service through [facility]. Patient is excited to use this service. I have communicated this change with the pharmacy. We confirmed his address. Patient advised to call [facility] when a refill is needed and they will initiate the fill/delivery.   Medication adherence denied.  Current BP Medications include:  Amlodipine 5 mg (has not started yet)   ASCVD risk factors include: hypertension, tobacco use, hyperlipidemia  Dietary habits: compliant with salt restriction, denies drinking excess caffeine Exercise habits: none Fhx: no pertinent positives Tobacco: current 0.5 PPD smoker Alcohol: 7 drink standards/weekly   O:  Vitals:   07/11/20 1620  BP: (!) 160/97  Pulse: 73   Home BP readings: none reported  Last 3 Office BP readings: BP Readings from Last 3 Encounters:  07/11/20 (!) 160/97  06/26/20 (!) 150/95  04/04/20 112/69    BMET    Component Value Date/Time   NA 137 11/22/2019 0919   K 4.2 11/22/2019 0919   CL 107 11/22/2019 0919   CO2 20 (L) 11/22/2019 0919   GLUCOSE 100 (H) 11/22/2019 0919   BUN 11 11/22/2019 0919   CREATININE 0.86 11/22/2019 0919   CALCIUM 9.0 11/22/2019 0919   GFRNONAA >60 11/22/2019 0919   GFRAA >60 11/22/2019 0919    Renal function: CrCl cannot be calculated (Patient's most recent lab result is older than the maximum 21 days allowed.).  Clinical ASCVD: No  The 10-year ASCVD  risk score Denman George DC Jr., et al., 2013) is: 23%   Values used to calculate the score:     Age: 50 years     Sex: Male     Is Non-Hispanic African American: Yes     Diabetic: No     Tobacco smoker: Yes     Systolic Blood Pressure: 160 mmHg     Is BP treated: Yes     HDL Cholesterol: 58 mg/dL     Total Cholesterol: 212 mg/dL  A/P: Hypertension longstanding currently uncontrolled on current medications. BP Goal = < 130/80 mmHg. Medication adherence denied - patient has not yet started amlodipine due to limited access to pharmacy. Patient will pick-up medication today after clinic visit; mail-order service initiated for future fills.   -Restarted amlodipine 5 mg daily.  -Counseled on lifestyle modifications for blood pressure control including reduced dietary sodium, increased exercise, adequate sleep.  Results reviewed and written information provided.   Total time in face-to-face counseling 15 minutes.   F/U Clinic Visit with pharmacy in 1 month.  Patient seen with Hailey.  Theodis Sato, PharmD PGY-1 [facility] Pharmacy Resident  07/11/2020 5:22 PM

## 2020-07-15 ENCOUNTER — Encounter (HOSPITAL_COMMUNITY): Payer: Self-pay

## 2020-07-15 ENCOUNTER — Other Ambulatory Visit: Payer: Self-pay

## 2020-07-15 ENCOUNTER — Ambulatory Visit (HOSPITAL_COMMUNITY)
Admission: EM | Admit: 2020-07-15 | Discharge: 2020-07-15 | Disposition: A | Discharge status: Home / Self Care | Payer: BC Managed Care – PPO | Attending: Physician Assistant | Admitting: Physician Assistant

## 2020-07-15 DIAGNOSIS — Z720 Tobacco use: Secondary | ICD-10-CM | POA: Diagnosis not present

## 2020-07-15 DIAGNOSIS — J449 Chronic obstructive pulmonary disease, unspecified: Secondary | ICD-10-CM

## 2020-07-15 DIAGNOSIS — J441 Chronic obstructive pulmonary disease with (acute) exacerbation: Secondary | ICD-10-CM

## 2020-07-15 MED ORDER — ALBUTEROL SULFATE HFA 108 (90 BASE) MCG/ACT IN AERS: 2.0000 | INHALATION_SPRAY | RESPIRATORY_TRACT | 0 refills | Status: DC | PRN

## 2020-07-15 MED ORDER — AEROCHAMBER PLUS FLO-VU LARGE MISC
Status: AC
  Filled 2020-07-15: qty 1

## 2020-07-15 MED ORDER — ALBUTEROL SULFATE HFA 108 (90 BASE) MCG/ACT IN AERS
2.0000 | INHALATION_SPRAY | Freq: Once | RESPIRATORY_TRACT | Status: AC
  Administered 2020-07-15: 2 via RESPIRATORY_TRACT

## 2020-07-15 MED ORDER — METHYLPREDNISOLONE SODIUM SUCC 125 MG IJ SOLR
60.0000 mg | Freq: Once | INTRAMUSCULAR | Status: AC
  Administered 2020-07-15: 60 mg via INTRAMUSCULAR

## 2020-07-15 MED ORDER — PREDNISONE 10 MG PO TABS: 20.0000 mg | ORAL_TABLET | Freq: Every day | ORAL | 0 refills | Status: AC

## 2020-07-15 MED ORDER — METHYLPREDNISOLONE SODIUM SUCC 125 MG IJ SOLR
INTRAMUSCULAR | Status: AC
  Filled 2020-07-15: qty 2

## 2020-07-15 MED ORDER — ALBUTEROL SULFATE HFA 108 (90 BASE) MCG/ACT IN AERS
INHALATION_SPRAY | RESPIRATORY_TRACT | Status: AC
  Filled 2020-07-15: qty 6.7

## 2020-07-15 NOTE — ED Triage Notes (Addendum)
Pt present SOB, symptoms started this am. Pt states that his COPD is giving him trouble and he does not have his inhaler. pt states that his back is hurting from all the coughing.

## 2020-07-15 NOTE — Discharge Instructions (Signed)
Take medications as prescribed Go to the Emergency Department if you have increased difficulty breathing Follow up with primary care physician.

## 2020-07-15 NOTE — ED Provider Notes (Signed)
[facility]    CSN: [id number] Arrival date & time: 07/15/20  1240      History   Chief Complaint Chief Complaint  Patient presents with  . Shortness of Breath    HPI Jesse Walters is a 50 y.o. male.   Pt presents with shortness of breath, wheezing that started this morning.  Pt with frequent COPD exacerbations.  He typically takes daily Advair, but reports the disk he currently has is not working.  He also uses prn albuterol, but reports he is out at this time.  Nebulizer not working this am.  He denies fever, chills, congestion.  On triage pt gasping for air, trouble talking in complete sentences. O2 90.     Past Medical History:  Diagnosis Date  . COPD (chronic obstructive pulmonary disease) (HCC)   . COPD (chronic obstructive pulmonary disease) (HCC)   . Hypertension   . Seasonal allergic rhinitis     Patient Active Problem List   Diagnosis Date Noted  . Mixed hyperlipidemia 06/27/2020  . Influenza vaccine refused 06/26/2020  . COVID-19 vaccination declined 06/26/2020  . Essential hypertension 11/26/2019  . Tobacco dependence 07/09/2019  . Tobacco abuse 07/02/2019  . Chronic obstructive pulmonary disease (HCC) 07/02/2019    Past Surgical History:  Procedure Laterality Date  . HERNIA REPAIR         Home Medications    Prior to Admission medications   Medication Sig Start Date End Date Taking? Authorizing Provider  ipratropium-albuterol (DUONEB) 0.5-2.5 (3) MG/3ML SOLN TAKE 3 MLS BY NEBULIZATION EVERY 6 (SIX) HOURS AS NEEDED. 04/06/20   Marcine Matar, MD  predniSONE (DELTASONE) 10 MG tablet Take 2 tablets (20 mg total) by mouth daily for 5 days. 07/15/20 07/20/20 Yes Teah Votaw, Shanda Bumps, PA-C  albuterol (PROVENTIL) (2.5 MG/3ML) 0.083% nebulizer solution Take 3 mLs (2.5 mg total) by nebulization every 6 (six) hours as needed for wheezing or shortness of breath. 06/26/20   Marcine Matar, MD  albuterol (VENTOLIN HFA) 108 (90 Base) MCG/ACT inhaler  Inhale 2 puffs into the lungs every 4 (four) hours as needed for wheezing or shortness of breath. 07/15/20   Jodell Cipro, PA-C  amLODipine (NORVASC) 5 MG tablet Take 1 tablet (5 mg total) by mouth daily. 06/26/20   Marcine Matar, MD  atorvastatin (LIPITOR) 10 MG tablet Take 1 tablet (10 mg total) by mouth daily. 06/27/20   Marcine Matar, MD  Fluticasone-Salmeterol (ADVAIR DISKUS) 250-50 MCG/DOSE AEPB Inhale 1 puff into the lungs 2 (two) times daily. 06/26/20   Marcine Matar, MD  nicotine polacrilex (NICORETTE) 2 MG gum Take 1 each (2 mg total) by mouth as needed for smoking cessation. 06/26/20   Marcine Matar, MD    Family History Family History  Problem Relation Age of Onset  . Cancer Mother     Social History Social History   Tobacco Use  . Smoking status: Current Every Day Smoker    Packs/day: 0.50    Types: Cigarettes  . Smokeless tobacco: Never Used  . Tobacco comment: trying to quit  Vaping Use  . Vaping Use: Never used  Substance Use Topics  . Alcohol use: Yes    Alcohol/week: 7.0 standard drinks    Types: 7 Cans of beer per week  . Drug use: Yes    Types: Marijuana     Allergies   Dilaudid [hydromorphone hcl]   Review of Systems Review of Systems  Constitutional: Negative for chills and fever.  HENT: Negative  for ear pain and sore throat.   Eyes: Negative for pain and visual disturbance.  Respiratory: Positive for cough, shortness of breath and wheezing.   Cardiovascular: Negative for chest pain and palpitations.  Gastrointestinal: Negative for abdominal pain and vomiting.  Genitourinary: Negative for dysuria and hematuria.  Musculoskeletal: Negative for arthralgias and back pain.  Skin: Negative for color change and rash.  Neurological: Negative for seizures and syncope.  All other systems reviewed and are negative.    Physical Exam Triage Vital Signs ED Triage Vitals  Enc Vitals Group     BP 07/15/20 1245 (!) 158/80     Pulse  Rate 07/15/20 1245 (!) 111     Resp 07/15/20 1245 (!) 22     Temp 07/15/20 1245 97.8 F (36.6 C)     Temp Source 07/15/20 1245 Oral     SpO2 07/15/20 1245 90 %     Weight --      Height --      Head Circumference --      Peak Flow --      Pain Score 07/15/20 1243 6     Pain Loc --      Pain Edu? --      Excl. in GC? --    No data found.  Updated Vital Signs BP (!) 158/80 (BP Location: Left Arm)   Pulse 92   Temp 97.8 F (36.6 C) (Oral)   Resp (!) 22   SpO2 95%   Visual Acuity Right Eye Distance:   Left Eye Distance:   Bilateral Distance:    Right Eye Near:   Left Eye Near:    Bilateral Near:     Physical Exam Vitals and nursing note reviewed.  Constitutional:      Appearance: He is well-developed.  HENT:     Head: Normocephalic and atraumatic.  Eyes:     Conjunctiva/sclera: Conjunctivae normal.  Cardiovascular:     Rate and Rhythm: Normal rate and regular rhythm.     Heart sounds: No murmur heard.   Pulmonary:     Effort: Pulmonary effort is normal.     Breath sounds: Wheezing present.     Comments: On initial exam pt gasping for air.  Abdominal:     Palpations: Abdomen is soft.     Tenderness: There is no abdominal tenderness.  Musculoskeletal:     Cervical back: Neck supple.  Skin:    General: Skin is warm and dry.  Neurological:     Mental Status: He is alert.      UC Treatments / Results  Labs (all labs ordered are listed, but only abnormal results are displayed) Labs Reviewed - No data to display  EKG   Radiology No results found.  Procedures Procedures (including critical care time)  Medications Ordered in UC Medications  albuterol (VENTOLIN HFA) 108 (90 Base) MCG/ACT inhaler 2 puff (2 puffs Inhalation Given 07/15/20 1306)  methylPREDNISolone sodium succinate (SOLU-MEDROL) 125 mg/2 mL injection 60 mg (60 mg Intramuscular Given 07/15/20 1305)    Initial Impression / Assessment and Plan / UC Course  I have reviewed the triage  vital signs and the nursing notes.  Pertinent labs & imaging results that were available during my care of the patient were reviewed by me and considered in my medical decision making (see chart for details).     On initial exam pt with wheezing, difficulty speaking in complete sentences. O2 90% on room air.  After albuterol inhaler administration breathing improved,  pt speaking in complete sentences, minimal wheezing heard on auscultation, O2 97%.   Prednisone prescribed.   Albuterol inhaler sent to pharmacy along with prednisone as pt reports he was unable to get it from his pharmacy.  The prescription has multiple refills, but he reports he wasn't able to get it.    He also reports Advair disc isn't working, this prescription has 6 refills.  He reports he couldn't get it from the pharmacy.  Advised him to discuss with pharmacy since current disc isn't working.   Advised to follow up with PCP.  Final Clinical Impressions(s) / UC Diagnoses   Final diagnoses:  COPD exacerbation [facility])     Discharge Instructions     Take medications as prescribed Go to the Emergency Department if you have increased difficulty breathing Follow up with primary care physician.    ED Prescriptions    Medication Sig Dispense Auth. Provider   albuterol (VENTOLIN HFA) 108 (90 Base) MCG/ACT inhaler Inhale 2 puffs into the lungs every 4 (four) hours as needed for wheezing or shortness of breath. 18 g Pihu Basil, PA-C   predniSONE (DELTASONE) 10 MG tablet Take 2 tablets (20 mg total) by mouth daily for 5 days. 10 tablet Jodell Cipro, PA-C     PDMP not reviewed this encounter.   Jodell Cipro, PA-C 07/15/20 1421

## 2020-07-29 ENCOUNTER — Other Ambulatory Visit: Payer: Self-pay

## 2020-07-29 ENCOUNTER — Emergency Department (HOSPITAL_COMMUNITY): Payer: BC Managed Care – PPO

## 2020-07-29 ENCOUNTER — Inpatient Hospital Stay (HOSPITAL_COMMUNITY)
Admission: EM | Admit: 2020-07-29 | Discharge: 2020-07-31 | DRG: 190 | Disposition: A | Discharge status: Home / Self Care | Payer: BC Managed Care – PPO | Attending: Internal Medicine | Admitting: Internal Medicine

## 2020-07-29 DIAGNOSIS — R000 Tachycardia, unspecified: Secondary | ICD-10-CM | POA: Diagnosis present

## 2020-07-29 DIAGNOSIS — E785 Hyperlipidemia, unspecified: Secondary | ICD-10-CM | POA: Diagnosis present

## 2020-07-29 DIAGNOSIS — D72828 Other elevated white blood cell count: Secondary | ICD-10-CM | POA: Diagnosis present

## 2020-07-29 DIAGNOSIS — I10 Essential (primary) hypertension: Secondary | ICD-10-CM | POA: Diagnosis present

## 2020-07-29 DIAGNOSIS — J441 Chronic obstructive pulmonary disease with (acute) exacerbation: Principal | ICD-10-CM | POA: Diagnosis present

## 2020-07-29 DIAGNOSIS — J9601 Acute respiratory failure with hypoxia: Secondary | ICD-10-CM | POA: Diagnosis present

## 2020-07-29 DIAGNOSIS — Z20822 Contact with and (suspected) exposure to covid-19: Secondary | ICD-10-CM | POA: Diagnosis present

## 2020-07-29 DIAGNOSIS — Z888 Allergy status to other drugs, medicaments and biological substances status: Secondary | ICD-10-CM

## 2020-07-29 DIAGNOSIS — Z79899 Other long term (current) drug therapy: Secondary | ICD-10-CM

## 2020-07-29 DIAGNOSIS — F17210 Nicotine dependence, cigarettes, uncomplicated: Secondary | ICD-10-CM | POA: Diagnosis present

## 2020-07-29 DIAGNOSIS — J449 Chronic obstructive pulmonary disease, unspecified: Secondary | ICD-10-CM | POA: Diagnosis present

## 2020-07-29 DIAGNOSIS — T380X5A Adverse effect of glucocorticoids and synthetic analogues, initial encounter: Secondary | ICD-10-CM | POA: Diagnosis present

## 2020-07-29 DIAGNOSIS — Z720 Tobacco use: Secondary | ICD-10-CM | POA: Diagnosis present

## 2020-07-29 DIAGNOSIS — Z7952 Long term (current) use of systemic steroids: Secondary | ICD-10-CM

## 2020-07-29 LAB — I-STAT VENOUS BLOOD GAS, ED
Acid-base deficit: 1 mmol/L (ref 0.0–2.0)
Bicarbonate: 25 mmol/L (ref 20.0–28.0)
Calcium, Ion: 1.14 mmol/L — ABNORMAL LOW (ref 1.15–1.40)
HCT: 48 % (ref 39.0–52.0)
Hemoglobin: 16.3 g/dL (ref 13.0–17.0)
O2 Saturation: 88 %
Potassium: 3.7 mmol/L (ref 3.5–5.1)
Sodium: 138 mmol/L (ref 135–145)
TCO2: 26 mmol/L (ref 22–32)
pCO2, Ven: 44 mmHg (ref 44.0–60.0)
pH, Ven: 7.363 (ref 7.250–7.430)
pO2, Ven: 57 mmHg — ABNORMAL HIGH (ref 32.0–45.0)

## 2020-07-29 LAB — BASIC METABOLIC PANEL
Anion gap: 9 (ref 5–15)
BUN: 15 mg/dL (ref 6–20)
CO2: 24 mmol/L (ref 22–32)
Calcium: 9.3 mg/dL (ref 8.9–10.3)
Chloride: 104 mmol/L (ref 98–111)
Creatinine, Ser: 0.81 mg/dL (ref 0.61–1.24)
GFR, Estimated: >60 mL/min (ref >60)
Glucose, Bld: 115 mg/dL — ABNORMAL HIGH (ref 70–99)
Potassium: 3.8 mmol/L (ref 3.5–5.1)
Sodium: 137 mmol/L (ref 135–145)

## 2020-07-29 LAB — CBC WITH DIFFERENTIAL/PLATELET
Abs Immature Granulocytes: 0.04 10*3/uL (ref 0.00–0.07)
Basophils Absolute: 0.1 10*3/uL (ref 0.0–0.1)
Basophils Relative: 1 %
Eosinophils Absolute: 0.6 10*3/uL — ABNORMAL HIGH (ref 0.0–0.5)
Eosinophils Relative: 4 %
HCT: 45.3 % (ref 39.0–52.0)
Hemoglobin: 15.1 g/dL (ref 13.0–17.0)
Immature Granulocytes: 0 %
Lymphocytes Relative: 28 %
Lymphs Abs: 3.8 10*3/uL (ref 0.7–4.0)
MCH: 30.3 pg (ref 26.0–34.0)
MCHC: 33.3 g/dL (ref 30.0–36.0)
MCV: 90.8 fL (ref 80.0–100.0)
Monocytes Absolute: 1 10*3/uL (ref 0.1–1.0)
Monocytes Relative: 7 %
Neutro Abs: 8.3 10*3/uL — ABNORMAL HIGH (ref 1.7–7.7)
Neutrophils Relative %: 60 %
Platelets: 285 10*3/uL (ref 150–400)
RBC: 4.99 MIL/uL (ref 4.22–5.81)
RDW: 13.3 % (ref 11.5–15.5)
WBC: 13.8 10*3/uL — ABNORMAL HIGH (ref 4.0–10.5)
nRBC: 0 % (ref 0.0–0.2)

## 2020-07-29 LAB — RESP PANEL BY RT-PCR (FLU A&B, COVID) ARPGX2
Influenza A by PCR: NEGATIVE
Influenza B by PCR: NEGATIVE
SARS Coronavirus 2 by RT PCR: NEGATIVE

## 2020-07-29 MED ORDER — THIAMINE HCL 100 MG PO TABS
100.0000 mg | ORAL_TABLET | Freq: Every day | ORAL | Status: DC
  Administered 2020-07-29 – 2020-07-31 (×3): 100 mg via ORAL
  Filled 2020-07-29 (×3): qty 1

## 2020-07-29 MED ORDER — ADULT MULTIVITAMIN W/MINERALS CH
1.0000 | ORAL_TABLET | Freq: Every day | ORAL | Status: DC
  Administered 2020-07-29 – 2020-07-31 (×3): 1 via ORAL
  Filled 2020-07-29 (×3): qty 1

## 2020-07-29 MED ORDER — IPRATROPIUM BROMIDE 0.02 % IN SOLN
0.5000 mg | Freq: Four times a day (QID) | RESPIRATORY_TRACT | Status: DC
  Administered 2020-07-30 (×2): 0.5 mg via RESPIRATORY_TRACT
  Filled 2020-07-29 (×2): qty 2.5

## 2020-07-29 MED ORDER — LORAZEPAM 1 MG PO TABS
1.0000 mg | ORAL_TABLET | ORAL | Status: DC | PRN
  Administered 2020-07-30: 2 mg via ORAL
  Filled 2020-07-29: qty 2

## 2020-07-29 MED ORDER — LEVALBUTEROL HCL 1.25 MG/0.5ML IN NEBU
1.2500 mg | INHALATION_SOLUTION | Freq: Four times a day (QID) | RESPIRATORY_TRACT | Status: DC
  Administered 2020-07-30 (×2): 1.25 mg via RESPIRATORY_TRACT
  Filled 2020-07-29 (×2): qty 0.5

## 2020-07-29 MED ORDER — ATORVASTATIN CALCIUM 10 MG PO TABS
10.0000 mg | ORAL_TABLET | Freq: Every day | ORAL | Status: DC
  Administered 2020-07-30 – 2020-07-31 (×2): 10 mg via ORAL
  Filled 2020-07-29 (×2): qty 1

## 2020-07-29 MED ORDER — IPRATROPIUM BROMIDE 0.02 % IN SOLN: 0.5000 mg | Freq: Four times a day (QID) | RESPIRATORY_TRACT | Status: DC

## 2020-07-29 MED ORDER — THIAMINE HCL 100 MG/ML IJ SOLN: 100.0000 mg | Freq: Every day | INTRAMUSCULAR | Status: DC

## 2020-07-29 MED ORDER — LEVALBUTEROL HCL 1.25 MG/0.5ML IN NEBU: 1.2500 mg | INHALATION_SOLUTION | Freq: Four times a day (QID) | RESPIRATORY_TRACT | Status: DC

## 2020-07-29 MED ORDER — ALBUTEROL (5 MG/ML) CONTINUOUS INHALATION SOLN
10.0000 mg/h | INHALATION_SOLUTION | Freq: Once | RESPIRATORY_TRACT | Status: AC
  Administered 2020-07-29: 10 mg/h via RESPIRATORY_TRACT
  Filled 2020-07-29: qty 20

## 2020-07-29 MED ORDER — METHYLPREDNISOLONE SODIUM SUCC 40 MG IJ SOLR
40.0000 mg | Freq: Every day | INTRAMUSCULAR | Status: DC
  Administered 2020-07-30: 40 mg via INTRAVENOUS
  Filled 2020-07-29: qty 1

## 2020-07-29 MED ORDER — SODIUM CHLORIDE 0.9 % IV BOLUS
1000.0000 mL | Freq: Once | INTRAVENOUS | Status: AC
  Administered 2020-07-29: 1000 mL via INTRAVENOUS

## 2020-07-29 MED ORDER — LORAZEPAM 2 MG/ML IJ SOLN: 1.0000 mg | INTRAMUSCULAR | Status: DC | PRN

## 2020-07-29 MED ORDER — ENOXAPARIN SODIUM 40 MG/0.4ML ~~LOC~~ SOLN
40.0000 mg | SUBCUTANEOUS | Status: DC
  Administered 2020-07-29 – 2020-07-30 (×2): 40 mg via SUBCUTANEOUS
  Filled 2020-07-29 (×2): qty 0.4

## 2020-07-29 MED ORDER — FOLIC ACID 1 MG PO TABS
1.0000 mg | ORAL_TABLET | Freq: Every day | ORAL | Status: DC
  Administered 2020-07-29 – 2020-07-30 (×2): 1 mg via ORAL
  Filled 2020-07-29 (×2): qty 1

## 2020-07-29 MED ORDER — AMLODIPINE BESYLATE 5 MG PO TABS
5.0000 mg | ORAL_TABLET | Freq: Every day | ORAL | Status: DC
  Administered 2020-07-30 – 2020-07-31 (×2): 5 mg via ORAL
  Filled 2020-07-29 (×2): qty 1

## 2020-07-29 NOTE — ED Provider Notes (Signed)
[facility] EMERGENCY DEPARTMENT Provider Note   CSN: [id number] Arrival date & time: 07/29/20  1634     History No chief complaint on file.   Jesse Walters is a 50 y.o. male.  HPI Patient presents by EMS for evaluation of severe shortness of breath which apparently started today.  EMS found him to be in respiratory distress on arrival.  First responder had been there previously and started him on oxygen.  It is unclear what his room air saturation was.  He was also treated with albuterol, Atrovent, Solu-Medrol and magnesium during transport.  Patient denies cough, fever, chills.  He denies Covid infection.  He has not had COVID vaccines.  He is currently taking "1 prednisone pill a day," which was prescribed by his doctor.  There are no other known modifying factors.    Past Medical History:  Diagnosis Date  . COPD (chronic obstructive pulmonary disease) (HCC)   . COPD (chronic obstructive pulmonary disease) (HCC)   . Hypertension   . Seasonal allergic rhinitis     Patient Active Problem List   Diagnosis Date Noted  . Mixed hyperlipidemia 06/27/2020  . Influenza vaccine refused 06/26/2020  . COVID-19 vaccination declined 06/26/2020  . Essential hypertension 11/26/2019  . Tobacco dependence 07/09/2019  . Tobacco abuse 07/02/2019  . Chronic obstructive pulmonary disease (HCC) 07/02/2019    Past Surgical History:  Procedure Laterality Date  . HERNIA REPAIR         Family History  Problem Relation Age of Onset  . Cancer Mother     Social History   Tobacco Use  . Smoking status: Current Every Day Smoker    Packs/day: 0.50    Types: Cigarettes  . Smokeless tobacco: Never Used  . Tobacco comment: trying to quit  Vaping Use  . Vaping Use: Never used  Substance Use Topics  . Alcohol use: Yes    Alcohol/week: 7.0 standard drinks    Types: 7 Cans of beer per week  . Drug use: Yes    Types: Marijuana    Home Medications Prior to Admission  medications   Medication Sig Start Date End Date Taking? Authorizing Provider  albuterol (PROVENTIL) (2.5 MG/3ML) 0.083% nebulizer solution Take 3 mLs (2.5 mg total) by nebulization every 6 (six) hours as needed for wheezing or shortness of breath. 06/26/20   Marcine Matar, MD  albuterol (VENTOLIN HFA) 108 (90 Base) MCG/ACT inhaler Inhale 2 puffs into the lungs every 4 (four) hours as needed for wheezing or shortness of breath. 07/15/20   Jodell Cipro, PA-C  amLODipine (NORVASC) 5 MG tablet Take 1 tablet (5 mg total) by mouth daily. 06/26/20   Marcine Matar, MD  atorvastatin (LIPITOR) 10 MG tablet Take 1 tablet (10 mg total) by mouth daily. 06/27/20   Marcine Matar, MD  Fluticasone-Salmeterol (ADVAIR DISKUS) 250-50 MCG/DOSE AEPB Inhale 1 puff into the lungs 2 (two) times daily. 06/26/20   Marcine Matar, MD  ipratropium-albuterol (DUONEB) 0.5-2.5 (3) MG/3ML SOLN TAKE 3 MLS BY NEBULIZATION EVERY 6 (SIX) HOURS AS NEEDED. 04/06/20   Marcine Matar, MD  nicotine polacrilex (NICORETTE) 2 MG gum Take 1 each (2 mg total) by mouth as needed for smoking cessation. 06/26/20   Marcine Matar, MD    Allergies    Dilaudid [hydromorphone hcl]  Review of Systems   Review of Systems  All other systems reviewed and are negative.   Physical Exam Updated Vital Signs BP 129/87   Pulse Marland Kitchen)  121   Temp 98.5 F (36.9 C) (Oral)   Resp 16   SpO2 93%   Physical Exam Vitals and nursing note reviewed.  Constitutional:      Appearance: He is well-developed.  HENT:     Head: Normocephalic and atraumatic.     Right Ear: External ear normal.     Left Ear: External ear normal.     Mouth/Throat:     Mouth: Mucous membranes are moist.     Pharynx: No oropharyngeal exudate or posterior oropharyngeal erythema.  Eyes:     Conjunctiva/sclera: Conjunctivae normal.     Pupils: Pupils are equal, round, and reactive to light.  Neck:     Trachea: Phonation normal.  Cardiovascular:     Rate  and Rhythm: Normal rate and regular rhythm.     Heart sounds: Normal heart sounds.  Pulmonary:     Effort: Respiratory distress present.     Breath sounds: No stridor. Wheezing present.     Comments: Increased work of breathing.  Intercostal and abdominal retractions Abdominal:     General: There is no distension.     Palpations: Abdomen is soft.     Tenderness: There is no abdominal tenderness.  Musculoskeletal:        General: Normal range of motion.     Cervical back: Normal range of motion and neck supple.  Skin:    General: Skin is warm and dry.  Neurological:     Mental Status: He is alert and oriented to person, place, and time.     Cranial Nerves: No cranial nerve deficit.     Sensory: No sensory deficit.     Motor: No abnormal muscle tone.     Coordination: Coordination normal.  Psychiatric:        Mood and Affect: Mood normal.        Behavior: Behavior normal.        Thought Content: Thought content normal.        Judgment: Judgment normal.     ED Results / Procedures / Treatments   Labs (all labs ordered are listed, but only abnormal results are displayed) Labs Reviewed  BASIC METABOLIC PANEL - Abnormal; Notable for the following components:      Result Value   Glucose, Bld 115 (*)    All other components within normal limits  CBC WITH DIFFERENTIAL/PLATELET - Abnormal; Notable for the following components:   WBC 13.8 (*)    Neutro Abs 8.3 (*)    Eosinophils Absolute 0.6 (*)    All other components within normal limits  I-STAT VENOUS BLOOD GAS, ED - Abnormal; Notable for the following components:   pO2, Ven 57.0 (*)    Calcium, Ion 1.14 (*)    All other components within normal limits  RESP PANEL BY RT-PCR (FLU A&B, COVID) [id number]    EKG None  Radiology DG Chest Port 1 View  Result Date: 07/29/2020 CLINICAL DATA:  Short of breath since this morning, COPD EXAM: PORTABLE CHEST 1 VIEW COMPARISON:  11/22/2019 FINDINGS: Two upright frontal views of the chest  are obtained. Cardiac silhouette is unremarkable. No airspace disease, effusion, or pneumothorax. Stable emphysema. No acute bony abnormalities. IMPRESSION: 1. Emphysema.  No acute airspace disease. Electronically Signed   By: Sharlet SalinaMichael  Brown M.D.   On: 07/29/2020 17:06    Procedures .Critical Care Performed by: Mancel BaleWentz, Claryssa Sandner, MD Authorized by: Mancel BaleWentz, Kirkland Figg, MD   Critical care provider statement:    Critical care time (minutes):  45  Critical care start time:  07/29/2020 4:40 PM   Critical care end time:  07/29/2020 8:27 PM   Critical care time was exclusive of:  Separately billable procedures and treating other patients   Critical care was necessary to treat or prevent imminent or life-threatening deterioration of the following conditions:  Respiratory failure   Critical care was time spent personally by me on the following activities:  Blood draw for specimens, development of treatment plan with patient or surrogate, discussions with consultants, evaluation of patient's response to treatment, examination of patient, obtaining history from patient or surrogate, ordering and performing treatments and interventions, ordering and review of laboratory studies, pulse oximetry, re-evaluation of patient's condition, review of old charts and ordering and review of radiographic studies     Medications Ordered in ED Medications  sodium chloride 0.9 % bolus 1,000 mL (has no administration in time range)  albuterol (PROVENTIL,VENTOLIN) solution continuous neb (10 mg/hr Nebulization Given by Other 07/29/20 1642)    ED Course  I have reviewed the triage vital signs and the nursing notes.  Pertinent labs & imaging results that were available during my care of the patient were reviewed by me and considered in my medical decision making (see chart for details).  Clinical Course as of 07/29/20 2026  Sat Jul 29, 2020  1655 Patient comfortable now sitting up on the edge of the bed, getting a continuous  nebulizer.  Current oxygen requirement 3 L by nasal cannula, with normal oxygenation. [EW]  2011 Currently heart rate 125-130, oxygenation 90 to 93% on room air. [EW]    Clinical Course User Index [EW] Mancel Bale, MD   MDM Rules/Calculators/A&P                           Patient Vitals for the past 24 hrs:  BP Temp Temp src Pulse Resp SpO2  07/29/20 2000 -- -- -- -- -- 93 %  07/29/20 1930 129/87 -- -- (!) 121 16 91 %  07/29/20 1915 133/71 -- -- (!) 121 (!) 32 93 %  07/29/20 1900 131/75 -- -- (!) 113 (!) 28 95 %  07/29/20 1845 137/78 -- -- (!) 108 (!) 25 96 %  07/29/20 1830 129/78 -- -- (!) 112 (!) 30 95 %  07/29/20 1745 (!) 158/78 -- -- (!) 148 (!) 22 92 %  07/29/20 1730 136/84 -- -- (!) 108 (!) 32 97 %  07/29/20 1715 (!) 141/84 -- -- (!) 121 (!) 26 95 %  07/29/20 1645 (!) 141/95 -- -- (!) 126 (!) 29 92 %  07/29/20 1639 (!) 173/98 98.5 F (36.9 C) Oral 92 (!) 26 92 %    8:26 PM Reevaluation with update and discussion. After initial assessment and treatment, an updated evaluation reveals he remains fairly comfortable off oxygen.  Findings discussed with patient all questions were answered. Mancel Bale   Medical Decision Making:  This patient is presenting for evaluation of shortness of breath, which does require a range of treatment options, and is a complaint that involves a high risk of morbidity and mortality. The differential diagnoses include pneumonia, Covid infection, COPD exacerbation. I decided to review old records, and in summary patient presenting with signs symptoms of COPD exacerbation, unclear oxygen status..  I did not require additional historical information from anyone.  Clinical Laboratory Tests Ordered, included CBC, Metabolic panel and Venous blood gas, Covid test, flu test. Review indicates normal. Radiologic Tests Ordered, included chest x-ray.  I independently Visualized: Radiograph images, which show no infiltrate or edema.  Emphysema.  Cardiac  Monitor Tracing which shows sinus tachycardia    Critical Interventions-EPIC evaluation, continuous albuterol nebulizer, observation reassessment  After These Interventions, the Patient was reevaluated and was found with persistent tachycardia after treatment.  Oxygenation is borderline on room air 90 to 93%.  CRITICAL CARE-yes Performed by: Mancel Bale  Nursing Notes Reviewed/ Care Coordinated Applicable Imaging Reviewed Interpretation of Laboratory Data incorporated into ED treatment   8:28 PM-Consult complete with hospitalist. Patient case explained and discussed.  She agrees to admit patient for further evaluation and treatment. Call ended at 8:37 PM    Final Clinical Impression(s) / ED Diagnoses Final diagnoses:  COPD exacerbation (HCC)  Tachycardia    Rx / DC Orders ED Discharge Orders    None       Mancel Bale, MD 07/29/20 2037

## 2020-07-29 NOTE — H&P (Signed)
History and Physical    Victory Strollo SEG:[id number] DOB: 23-Jun-1970 DOA: 07/29/2020  PCP: Marcine Matar, MD  Patient coming from: Home   I have personally briefly reviewed patient's old medical records in [facility]  Chief Complaint: Increasing shortness of breath  HPI: Jesse Walters is a 50 y.o. male with medical history significant for hypertension, COPD, tobacco use who presents with concerns of increasing shortness of breath.  Patient has been having issues with increasing shortness of breath and wheezing for the past week.  States he feels like he was not getting any relief from his Advair and thinks perhaps the inhaler is not working.  Has not had time to pick up a new inhaler.  His nebulizer machine also was broken.  He was seen in urgent care on 3/12 and treated with Solu-Medrol, albuterol and was discharged with 20 mg prednisone and albuterol inhaler.  He has been taking the albuterol at least twice a day with minimal relief and today his breathing acutely worsened so EMS was called. He endorsed a dry cough.  Has some left sided back pain but no chest pain.  Denies any fever.  He continues to smoke about 10 cigarettes/day.  On route, he was placed on CPAP and was given 125 Solu-Medrol, 2 g of magnesium, albuterol and ipratropium.  He was able to eventually be weaned down to room air here in the ED after some continuous nebulized albuterol.  He was notably tachycardic up to 120s and was diaphoretic during my evaluation.  Patient also endorsed that he drinks about 50 ounces of beer daily with last drink being yesterday.  CBC shows mild leukocytosis of 13.8 but he has been taking chronic prednisone.  No other significant electrolyte abnormality.  CXR are consistent with emphysema but no acute findings.  Review of Systems: Constitutional: No Weight Change, No Fever ENT/Mouth: No sore throat, No Rhinorrhea Eyes: No Eye Pain, No Vision Changes Cardiovascular: No Chest Pain,+ SOB,  No PND, + Dyspnea on Exertion, No Orthopnea, No Claudication,  No Palpitations Respiratory: + Cough, No Sputum, +Wheezing, + Dyspnea  Gastrointestinal: No Nausea, No Vomiting, No Diarrhea Genitourinary:  No Urgency, No Flank Pain Musculoskeletal: No Arthralgias, No Myalgias Skin: No Skin Lesions, No Pruritus, Neuro: no Weakness, No Numbnes Psych: No Anxiety/Panic, No Depression, no decrease appetite Heme/Lymph: No Bruising, No Bleeding  Past Medical History:  Diagnosis Date  . COPD (chronic obstructive pulmonary disease) (HCC)   . COPD (chronic obstructive pulmonary disease) (HCC)   . Hypertension   . Seasonal allergic rhinitis     Past Surgical History:  Procedure Laterality Date  . HERNIA REPAIR       reports that he has been smoking cigarettes. He has been smoking about 0.50 packs per day. He has never used smokeless tobacco. He reports current alcohol use of about 7.0 standard drinks of alcohol per week. He reports current drug use. Drug: Marijuana. Social History  Allergies  Allergen Reactions  . Dilaudid [Hydromorphone Hcl] Hives    Family History  Problem Relation Age of Onset  . Cancer Mother      Prior to Admission medications   Medication Sig Start Date End Date Taking? Authorizing Provider  albuterol (PROVENTIL) (2.5 MG/3ML) 0.083% nebulizer solution Take 3 mLs (2.5 mg total) by nebulization every 6 (six) hours as needed for wheezing or shortness of breath. 06/26/20  Yes Marcine Matar, MD  albuterol (VENTOLIN HFA) 108 (90 Base) MCG/ACT inhaler Inhale 2 puffs into the lungs  every 4 (four) hours as needed for wheezing or shortness of breath. 07/15/20  Yes Zanetto, Jessica, PA-C  amLODipine (NORVASC) 5 MG tablet Take 1 tablet (5 mg total) by mouth daily. 06/26/20  Yes Marcine Matar, MD  atorvastatin (LIPITOR) 10 MG tablet Take 1 tablet (10 mg total) by mouth daily. 06/27/20  Yes Marcine Matar, MD  Fluticasone-Salmeterol (ADVAIR DISKUS) 250-50 MCG/DOSE  AEPB Inhale 1 puff into the lungs 2 (two) times daily. 06/26/20  Yes Marcine Matar, MD  predniSONE (DELTASONE) 10 MG tablet Take 10 mg by mouth daily with breakfast.   Yes [provider]  ipratropium-albuterol (DUONEB) 0.5-2.5 (3) MG/3ML SOLN TAKE 3 MLS BY NEBULIZATION EVERY 6 (SIX) HOURS AS NEEDED. Patient not taking: No sig reported 04/06/20   Marcine Matar, MD  nicotine polacrilex (NICORETTE) 2 MG gum Take 1 each (2 mg total) by mouth as needed for smoking cessation. Patient not taking: No sig reported 06/26/20   Marcine Matar, MD    Physical Exam: Vitals:   07/29/20 2030 07/29/20 2045 07/29/20 2100 07/29/20 2115  BP: (!) 141/82 126/78 132/80 133/79  Pulse: (!) 136 (!) 112 (!) 116 (!) 112  Resp: (!) 25 (!) 29 (!) 31 (!) 30  Temp:      TempSrc:      SpO2: 91% 91% 91% 90%    Constitutional: NAD, calm, comfortable, thin middle-age male sitting upright in bed appearing diaphoretic later in my evaluation Vitals:   07/29/20 2030 07/29/20 2045 07/29/20 2100 07/29/20 2115  BP: (!) 141/82 126/78 132/80 133/79  Pulse: (!) 136 (!) 112 (!) 116 (!) 112  Resp: (!) 25 (!) 29 (!) 31 (!) 30  Temp:      TempSrc:      SpO2: 91% 91% 91% 90%   Eyes: PERRL, lids and conjunctivae normal ENMT: Mucous membranes are moist.  Neck: normal, supple Respiratory: Poor aeration throughout with faint expiratory wheezing but no crackles.  Normal respiratory effort. No accessory muscle use.  Cardiovascular: Regular rate and rhythm, no murmurs / rubs / gallops. No extremity edema. Abdomen: no tenderness, no masses palpated.  Bowel sounds positive.  Musculoskeletal: no clubbing / cyanosis. No joint deformity upper and lower extremities. Good ROM, no contractures. Normal muscle tone.  Skin: no rashes, lesions, ulcers. No induration Neurologic: CN 2-12 grossly intact. Sensation intact,  Strength 5/5 in all 4.  Psychiatric: Normal judgment and insight. Alert and oriented x 3. Normal mood. Has  restless movements at time.     Labs on Admission: I have personally reviewed following labs and imaging studies  CBC: Recent Labs  Lab 07/29/20 1643 07/29/20 1651  WBC 13.8*  --   NEUTROABS 8.3*  --   HGB 15.1 16.3  HCT 45.3 48.0  MCV 90.8  --   PLT 285  --    Basic Metabolic Panel: Recent Labs  Lab 07/29/20 1643 07/29/20 1651  NA 137 138  K 3.8 3.7  CL 104  --   CO2 24  --   GLUCOSE 115*  --   BUN 15  --   CREATININE 0.81  --   CALCIUM 9.3  --    GFR: CrCl cannot be calculated (Unknown ideal weight.). Liver Function Tests: No results for input(s): AST, ALT, ALKPHOS, BILITOT, PROT, ALBUMIN in the last 168 hours. No results for input(s): LIPASE, AMYLASE in the last 168 hours. No results for input(s): AMMONIA in the last 168 hours. Coagulation Profile: No results for input(s): INR,  PROTIME in the last 168 hours. Cardiac Enzymes: No results for input(s): CKTOTAL, CKMB, CKMBINDEX, TROPONINI in the last 168 hours. BNP (last 3 results) No results for input(s): PROBNP in the last 8760 hours. HbA1C: No results for input(s): HGBA1C in the last 72 hours. CBG: No results for input(s): GLUCAP in the last 168 hours. Lipid Profile: No results for input(s): CHOL, HDL, LDLCALC, TRIG, CHOLHDL, LDLDIRECT in the last 72 hours. Thyroid Function Tests: No results for input(s): TSH, T4TOTAL, FREET4, T3FREE, THYROIDAB in the last 72 hours. Anemia Panel: No results for input(s): VITAMINB12, FOLATE, FERRITIN, TIBC, IRON, RETICCTPCT in the last 72 hours. Urine analysis: No results found for: COLORURINE, APPEARANCEUR, LABSPEC, PHURINE, GLUCOSEU, HGBUR, BILIRUBINUR, KETONESUR, PROTEINUR, UROBILINOGEN, NITRITE, LEUKOCYTESUR  Radiological Exams on Admission: DG Chest Port 1 View  Result Date: 07/29/2020 CLINICAL DATA:  Short of breath since this morning, COPD EXAM: PORTABLE CHEST 1 VIEW COMPARISON:  11/22/2019 FINDINGS: Two upright frontal views of the chest are obtained. Cardiac  silhouette is unremarkable. No airspace disease, effusion, or pneumothorax. Stable emphysema. No acute bony abnormalities. IMPRESSION: 1. Emphysema.  No acute airspace disease. Electronically Signed   By: Sharlet Salina M.D.   On: 07/29/2020 17:06      Assessment/Plan  Acute hypoxic respiratory failure secondary to COPD exacerbation -patient really on CPAP on route to ED and was able to wean down to room air after bronchodilator, mag and Solu-Medrol -Continue scheduled ipratropium-Xopenex q6hr -Continue IV 40 mg Solu-Medrol - Maintain O2 of 88-92%  Tachycardia  -Suspect likely due to increasing use of albuterol this week and receiving continuous nebulizer albuterol here.  Leukocytosis on lab work but elevated due to recent prednisone use.  Low suspicion for infection or sepsis. -Patient does endorse using about 50 ounces of alcohol daily.  He became diaphoretic during my evaluation.  Last drink was yesterday. Will start CIWA protocol - check UDS- he endorse marijuana use  Hypertension Continue amlodipine  DVT prophylaxis:.Lovenox Code Status: Full Family Communication: Plan discussed with patient at bedside  disposition Plan: Home with observation Consults called:  Admission status: Observation  Level of care: Telemetry Medical  Status is: Observation  The patient remains OBS appropriate and will d/c before 2 midnights.  Dispo: The patient is from: Home              Anticipated d/c is to: Home              Patient currently is not medically stable to d/c.   Difficult to place patient No         Anselm Jungling DO [facility]   If 7PM-7AM, please contact night-coverage [email or web address]   07/29/2020, 9:50 PM

## 2020-07-29 NOTE — ED Triage Notes (Addendum)
Pt arrives on CPAP, having shob since this morning. Hx of COPD, wheezing throughout. 125 mg solumedrol, 2g Mag, 10 mg albuterol and 0.5 mg atrovent given PTA. FD SpO2 room air 93%.

## 2020-07-29 NOTE — ED Notes (Addendum)
Assisted pt in ambulating around the nurses station three times. During this time the pt maintained SpO2 of 92% and was Tachycardic between 130-145. Pt was assisted back to bed and is now resting.

## 2020-07-30 DIAGNOSIS — J42 Unspecified chronic bronchitis: Secondary | ICD-10-CM | POA: Diagnosis not present

## 2020-07-30 DIAGNOSIS — T380X5A Adverse effect of glucocorticoids and synthetic analogues, initial encounter: Secondary | ICD-10-CM | POA: Diagnosis present

## 2020-07-30 DIAGNOSIS — R000 Tachycardia, unspecified: Secondary | ICD-10-CM | POA: Diagnosis present

## 2020-07-30 DIAGNOSIS — Z720 Tobacco use: Secondary | ICD-10-CM | POA: Diagnosis not present

## 2020-07-30 DIAGNOSIS — D72828 Other elevated white blood cell count: Secondary | ICD-10-CM | POA: Diagnosis present

## 2020-07-30 DIAGNOSIS — Z79899 Other long term (current) drug therapy: Secondary | ICD-10-CM | POA: Diagnosis not present

## 2020-07-30 DIAGNOSIS — J441 Chronic obstructive pulmonary disease with (acute) exacerbation: Secondary | ICD-10-CM | POA: Diagnosis not present

## 2020-07-30 DIAGNOSIS — F17210 Nicotine dependence, cigarettes, uncomplicated: Secondary | ICD-10-CM | POA: Diagnosis present

## 2020-07-30 DIAGNOSIS — Z7952 Long term (current) use of systemic steroids: Secondary | ICD-10-CM | POA: Diagnosis not present

## 2020-07-30 DIAGNOSIS — I10 Essential (primary) hypertension: Secondary | ICD-10-CM | POA: Diagnosis not present

## 2020-07-30 DIAGNOSIS — E785 Hyperlipidemia, unspecified: Secondary | ICD-10-CM | POA: Diagnosis present

## 2020-07-30 DIAGNOSIS — J9601 Acute respiratory failure with hypoxia: Secondary | ICD-10-CM | POA: Diagnosis not present

## 2020-07-30 DIAGNOSIS — J449 Chronic obstructive pulmonary disease, unspecified: Secondary | ICD-10-CM | POA: Diagnosis present

## 2020-07-30 DIAGNOSIS — Z888 Allergy status to other drugs, medicaments and biological substances status: Secondary | ICD-10-CM | POA: Diagnosis not present

## 2020-07-30 DIAGNOSIS — Z20822 Contact with and (suspected) exposure to covid-19: Secondary | ICD-10-CM | POA: Diagnosis present

## 2020-07-30 LAB — GLUCOSE, CAPILLARY: Glucose-Capillary: 120 mg/dL — ABNORMAL HIGH (ref 70–99)

## 2020-07-30 LAB — CBC
HCT: 43 % (ref 39.0–52.0)
Hemoglobin: 14.6 g/dL (ref 13.0–17.0)
MCH: 30.9 pg (ref 26.0–34.0)
MCHC: 34 g/dL (ref 30.0–36.0)
MCV: 90.9 fL (ref 80.0–100.0)
Platelets: 250 10*3/uL (ref 150–400)
RBC: 4.73 MIL/uL (ref 4.22–5.81)
RDW: 13.6 % (ref 11.5–15.5)
WBC: 17.1 10*3/uL — ABNORMAL HIGH (ref 4.0–10.5)
nRBC: 0 % (ref 0.0–0.2)

## 2020-07-30 LAB — BASIC METABOLIC PANEL
Anion gap: 15 (ref 5–15)
BUN: 16 mg/dL (ref 6–20)
CO2: 17 mmol/L — ABNORMAL LOW (ref 22–32)
Calcium: 9.1 mg/dL (ref 8.9–10.3)
Chloride: 104 mmol/L (ref 98–111)
Creatinine, Ser: 0.82 mg/dL (ref 0.61–1.24)
GFR, Estimated: >60 mL/min (ref >60)
Glucose, Bld: 122 mg/dL — ABNORMAL HIGH (ref 70–99)
Potassium: 3.9 mmol/L (ref 3.5–5.1)
Sodium: 136 mmol/L (ref 135–145)

## 2020-07-30 LAB — HIV ANTIBODY (ROUTINE TESTING W REFLEX): HIV Screen 4th Generation wRfx: NONREACTIVE

## 2020-07-30 LAB — RAPID URINE DRUG SCREEN, HOSP PERFORMED
Amphetamines: NOT DETECTED
Barbiturates: NOT DETECTED
Benzodiazepines: NOT DETECTED
Cocaine: NOT DETECTED
Opiates: NOT DETECTED
Tetrahydrocannabinol: POSITIVE — AB

## 2020-07-30 MED ORDER — MOMETASONE FURO-FORMOTEROL FUM 200-5 MCG/ACT IN AERO
2.0000 | INHALATION_SPRAY | Freq: Two times a day (BID) | RESPIRATORY_TRACT | Status: DC
  Administered 2020-07-30 – 2020-07-31 (×2): 2 via RESPIRATORY_TRACT
  Filled 2020-07-30: qty 8.8

## 2020-07-30 MED ORDER — AZITHROMYCIN 250 MG PO TABS
500.0000 mg | ORAL_TABLET | Freq: Every day | ORAL | Status: DC
  Administered 2020-07-30 – 2020-07-31 (×2): 500 mg via ORAL
  Filled 2020-07-30 (×2): qty 2

## 2020-07-30 MED ORDER — ALBUTEROL SULFATE HFA 108 (90 BASE) MCG/ACT IN AERS
1.0000 | INHALATION_SPRAY | RESPIRATORY_TRACT | Status: DC | PRN
  Filled 2020-07-30: qty 6.7

## 2020-07-30 MED ORDER — IPRATROPIUM-ALBUTEROL 0.5-2.5 (3) MG/3ML IN SOLN
3.0000 mL | Freq: Four times a day (QID) | RESPIRATORY_TRACT | Status: DC
  Administered 2020-07-30 – 2020-07-31 (×5): 3 mL via RESPIRATORY_TRACT
  Filled 2020-07-30 (×4): qty 3

## 2020-07-30 MED ORDER — METHYLPREDNISOLONE SODIUM SUCC 40 MG IJ SOLR
40.0000 mg | Freq: Two times a day (BID) | INTRAMUSCULAR | Status: DC
  Administered 2020-07-30 – 2020-07-31 (×2): 40 mg via INTRAVENOUS
  Filled 2020-07-30 (×2): qty 1

## 2020-07-30 MED ORDER — LORAZEPAM 1 MG PO TABS: 1.0000 mg | ORAL_TABLET | ORAL | Status: DC | PRN

## 2020-07-30 NOTE — Progress Notes (Signed)
PROGRESS NOTE    Jesse Walters  WUJ:[id number] DOB: Aug 16, 1970 DOA: 07/29/2020 PCP: Marcine Matar, MD    Brief Narrative:  Jesse Walters was admitted to the hospital with a working diagnosis of acute hypoxic respiratory failure due to COPD exacerbation.  50 year old male past medical history for hypertension, tobacco abuse and COPD who presented with shortness of breath.  Reported 7 days of worsening wheezing and dyspnea.  Apparently his nebulizer machine broke.  He was seen at urgent care on 3/12, diagnosed with COPD exacerbation, prescribed steroids and bronchodilators with no significant improvement of his symptoms.  Due to worsening symptoms he called EMS, on route he was in respiratory distress, he was placed on CPAP and received 125 mg of methylprednisolone intravenously along with magnesium and nebulized albuterol/ipratropium.  In the ED his physical examination showed a blood pressure 141/82, heart rate 136, respiratory rate 25-31, oxygen saturation 91% on supplemental oxygen per nasal cannula.  Heart S1-S2, present, tachycardic, lungs with poor ventilation, positive expiratory wheezing, no rales, abdomen soft, nontender, no lower extremity edema.  Venous blood gas pH 7.36, PCO2 44.0, bicarb 25.  Sodium 137, potassium 3.9, chloride 104, bicarb 24, glucose 115, BUN 15, creatinine 0.81, white count 13.8, hemoglobin 15.1, hematocrit 45.3, platelets 285. SARS COVID-19 negative. Toxicology screen positive for tetrahydrocannabinol.  Chest radiograph with significant hyperinflation.  Assessment & Plan:   Principal Problem:   COPD exacerbation (HCC) Active Problems:   Tobacco abuse   Essential hypertension   Acute respiratory failure with hypoxia (HCC)   1. Acute hypoxic respiratory failure due to acute COPD exacerbation. Patient continue to have dyspnea, not yet back to baseline.  Poor ventilation on lung auscultation.   Continue bronchodilator therapy with albuterol/ ipratropium  scheduled and as needed.  Resume inhaled corticosteroids and continue with methylprednisolone 40 mg Iv q12 hrs. Antitussive agents and airway clearing techniques with flutter valve and incentive spirometer.  Add Azithromycin 500 mg daily for 5 days for airway inflammation.   2. HTN/ dyslipidemia. Continue blood pressure control with amlodipine.   Continue with statin therapy.   3. Tobacco abuse. Smoking cessation counseling,   4. Alcohol use and abuse/ positive THC. No signs of acute alcohol withdrawal, will discontinue CIWA protocol but will continue with as needed lorazepam 1 mg as needed for anxiety q 4 hrs. Continue neuro checks per unit protocol and thiamine.   5. Reactive leukocytosis. No signs of bacterial infection, follow on cell count in am.   Patient continue to be at high risk for worsening respiratory failure   Status is: Observation  The patient will require care spanning > 2 midnights and should be moved to inpatient because: IV treatments appropriate due to intensity of illness or inability to take PO  Dispo: The patient is from: Home              Anticipated d/c is to: Home              Patient currently is not medically stable to d/c.   Difficult to place patient No    DVT prophylaxis: Enoxaparin   Code Status:   full  Family Communication:  No family at the bedside      Subjective: Patient with persistent dyspnea, worse in the early morning, no nausea or vomiting, no chest pain.   Objective: Vitals:   07/30/20 0018 07/30/20 0343 07/30/20 0400 07/30/20 0750  BP:  123/75 123/75   Pulse:  (!) 102 (!) 102   Resp:  18  Temp:  97.6 F (36.4 C)    TempSrc:  Oral    SpO2: 96% 92%  94%  Weight:  68.5 kg      Intake/Output Summary (Last 24 hours) at 07/30/2020 1210 Last data filed at 07/30/2020 0400 Gross per 24 hour  Intake 250 ml  Output 1100 ml  Net -850 ml   Filed Weights   07/30/20 0343  Weight: 68.5 kg    Examination:   General: positive  resting dyspnea,  Neurology: Awake and alert, non focal  E ENT: mild pallor, no icterus, oral mucosa moist Cardiovascular: No JVD. S1-S2 present, rhythmic, no gallops, rubs, or murmurs. No lower extremity edema. Pulmonary: positive breath sounds bilaterally, decreased air movement, scattered expiratory wheezing, rhonchi and rales. Gastrointestinal. Abdomen soft and non tender Skin. No rashes Musculoskeletal: no joint deformities     Data Reviewed: I have personally reviewed following labs and imaging studies  CBC: Recent Labs  Lab 07/29/20 1643 07/29/20 1651 07/30/20 0233  WBC 13.8*  --  17.1*  NEUTROABS 8.3*  --   --   HGB 15.1 16.3 14.6  HCT 45.3 48.0 43.0  MCV 90.8  --  90.9  PLT 285  --  250   Basic Metabolic Panel: Recent Labs  Lab 07/29/20 1643 07/29/20 1651 07/30/20 0233  NA 137 138 136  K 3.8 3.7 3.9  CL 104  --  104  CO2 24  --  17*  GLUCOSE 115*  --  122*  BUN 15  --  16  CREATININE 0.81  --  0.82  CALCIUM 9.3  --  9.1   GFR: Estimated Creatinine Clearance: 102.1 mL/min (by C-G formula based on SCr of 0.82 mg/dL). Liver Function Tests: No results for input(s): AST, ALT, ALKPHOS, BILITOT, PROT, ALBUMIN in the last 168 hours. No results for input(s): LIPASE, AMYLASE in the last 168 hours. No results for input(s): AMMONIA in the last 168 hours. Coagulation Profile: No results for input(s): INR, PROTIME in the last 168 hours. Cardiac Enzymes: No results for input(s): CKTOTAL, CKMB, CKMBINDEX, TROPONINI in the last 168 hours. BNP (last 3 results) No results for input(s): PROBNP in the last 8760 hours. HbA1C: No results for input(s): HGBA1C in the last 72 hours. CBG: Recent Labs  Lab 07/30/20 0733  GLUCAP 120*   Lipid Profile: No results for input(s): CHOL, HDL, LDLCALC, TRIG, CHOLHDL, LDLDIRECT in the last 72 hours. Thyroid Function Tests: No results for input(s): TSH, T4TOTAL, FREET4, T3FREE, THYROIDAB in the last 72 hours. Anemia Panel: No  results for input(s): VITAMINB12, FOLATE, FERRITIN, TIBC, IRON, RETICCTPCT in the last 72 hours.    Radiology Studies: I have reviewed all of the imaging during this hospital visit personally     Scheduled Meds: . amLODipine  5 mg Oral Daily  . atorvastatin  10 mg Oral Daily  . enoxaparin (LOVENOX) injection  40 mg Subcutaneous Q24H  . folic acid  1 mg Oral Daily  . ipratropium  0.5 mg Nebulization Q6H  . levalbuterol  1.25 mg Nebulization Q6H  . methylPREDNISolone (SOLU-MEDROL) injection  40 mg Intravenous Daily  . multivitamin with minerals  1 tablet Oral Daily  . thiamine  100 mg Oral Daily   Or  . thiamine  100 mg Intravenous Daily   Continuous Infusions:   LOS: 0 days        Mauricio Annett Gula, MD

## 2020-07-31 ENCOUNTER — Other Ambulatory Visit (HOSPITAL_COMMUNITY): Payer: Self-pay | Admitting: Internal Medicine

## 2020-07-31 DIAGNOSIS — J42 Unspecified chronic bronchitis: Secondary | ICD-10-CM

## 2020-07-31 LAB — BASIC METABOLIC PANEL
Anion gap: 6 (ref 5–15)
BUN: 17 mg/dL (ref 6–20)
CO2: 21 mmol/L — ABNORMAL LOW (ref 22–32)
Calcium: 9.3 mg/dL (ref 8.9–10.3)
Chloride: 105 mmol/L (ref 98–111)
Creatinine, Ser: 0.88 mg/dL (ref 0.61–1.24)
GFR, Estimated: >60 mL/min (ref >60)
Glucose, Bld: 126 mg/dL — ABNORMAL HIGH (ref 70–99)
Potassium: 4.3 mmol/L (ref 3.5–5.1)
Sodium: 132 mmol/L — ABNORMAL LOW (ref 135–145)

## 2020-07-31 LAB — CBC WITH DIFFERENTIAL/PLATELET
Abs Immature Granulocytes: 0.1 10*3/uL — ABNORMAL HIGH (ref 0.00–0.07)
Basophils Absolute: 0 10*3/uL (ref 0.0–0.1)
Basophils Relative: 0 %
Eosinophils Absolute: 0 10*3/uL (ref 0.0–0.5)
Eosinophils Relative: 0 %
HCT: 42.5 % (ref 39.0–52.0)
Hemoglobin: 14.5 g/dL (ref 13.0–17.0)
Immature Granulocytes: 1 %
Lymphocytes Relative: 5 %
Lymphs Abs: 0.9 10*3/uL (ref 0.7–4.0)
MCH: 30.5 pg (ref 26.0–34.0)
MCHC: 34.1 g/dL (ref 30.0–36.0)
MCV: 89.3 fL (ref 80.0–100.0)
Monocytes Absolute: 0.4 10*3/uL (ref 0.1–1.0)
Monocytes Relative: 2 %
Neutro Abs: 15.6 10*3/uL — ABNORMAL HIGH (ref 1.7–7.7)
Neutrophils Relative %: 92 %
Platelets: 279 10*3/uL (ref 150–400)
RBC: 4.76 MIL/uL (ref 4.22–5.81)
RDW: 13.6 % (ref 11.5–15.5)
WBC: 16.9 10*3/uL — ABNORMAL HIGH (ref 4.0–10.5)
nRBC: 0 % (ref 0.0–0.2)

## 2020-07-31 MED ORDER — PREDNISONE 20 MG PO TABS: 20.0000 mg | ORAL_TABLET | Freq: Every day | ORAL | 0 refills | Status: DC

## 2020-07-31 MED ORDER — IPRATROPIUM-ALBUTEROL 0.5-2.5 (3) MG/3ML IN SOLN: 3.0000 mL | RESPIRATORY_TRACT | Status: DC | PRN

## 2020-07-31 MED ORDER — PREDNISONE 20 MG PO TABS: 20.0000 mg | ORAL_TABLET | Freq: Every day | ORAL | Status: DC

## 2020-07-31 MED ORDER — IPRATROPIUM-ALBUTEROL 0.5-2.5 (3) MG/3ML IN SOLN: 3.0000 mL | Freq: Four times a day (QID) | RESPIRATORY_TRACT | 0 refills | Status: DC | PRN

## 2020-07-31 MED ORDER — FLUTICASONE-SALMETEROL 250-50 MCG/DOSE IN AEPB: 1.0000 | INHALATION_SPRAY | Freq: Two times a day (BID) | RESPIRATORY_TRACT | 0 refills | Status: DC

## 2020-07-31 MED ORDER — ALBUTEROL SULFATE HFA 108 (90 BASE) MCG/ACT IN AERS: 2.0000 | INHALATION_SPRAY | RESPIRATORY_TRACT | 0 refills | Status: DC | PRN

## 2020-07-31 MED FILL — ADVAIR 250/50 DISKUS: 250-50 | 30 days supply | Qty: 60 | Fill #0

## 2020-07-31 MED FILL — predniSONE 20 MG TABS: 20 | 3 days supply | Qty: 3 | Fill #0

## 2020-07-31 MED FILL — IPRAT-ALBUT 0.5-3(2.5) MG/3: 0.5-2.5 (3) | 30 days supply | Qty: 360 | Fill #0

## 2020-07-31 NOTE — Plan of Care (Signed)
  Problem: Education: Goal: Knowledge of disease or condition will improve Outcome: Adequate for Discharge Goal: Knowledge of the prescribed therapeutic regimen will improve Outcome: Adequate for Discharge Goal: Individualized Educational Video(s) Outcome: Adequate for Discharge   Problem: Activity: Goal: Ability to tolerate increased activity will improve Outcome: Adequate for Discharge Goal: Will verbalize the importance of balancing activity with adequate rest periods Outcome: Adequate for Discharge   Problem: Respiratory: Goal: Ability to maintain a clear airway will improve Outcome: Adequate for Discharge Goal: Levels of oxygenation will improve Outcome: Adequate for Discharge Goal: Ability to maintain adequate ventilation will improve Outcome: Adequate for Discharge   

## 2020-07-31 NOTE — Discharge Summary (Signed)
Physician Discharge Summary  Jesse AustinGerard Walters WUJ:[id number] DOB: 05/17/1970 DOA: 07/29/2020  PCP: Marcine MatarJohnson, Deborah B, MD  Admit date: 07/29/2020 Discharge date: 07/31/2020  Admitted From: Home  Disposition:  Home   Recommendations for Outpatient Follow-up and new medication changes:  1. Follow up with Dr. Laural BenesJohnson in 7 days.  2. Continue systemic corticosteroids for 3 more days, prednisone 20 mg daily. 3. Continue daily advair and as needed duoneb/ albuterol.  4. Smoking cessation   Home Health: no   Equipment/Devices: nebulizer machine    Discharge Condition: stable  CODE STATUS: full  Diet recommendation:  Regular   Brief/Interim Summary: Jesse Walters was admitted to the hospital with a working diagnosis of acute hypoxic respiratory failure due to COPD exacerbation.  50 year old male past medical history for hypertension, tobacco abuse and COPD who presented with shortness of breath.  Reported 7 days of worsening wheezing and dyspnea.  Apparently his nebulizer machine broke.  He was seen at urgent care on 3/12, diagnosed with COPD exacerbation, prescribed steroids and bronchodilators with no significant improvement of his symptoms.  Due to worsening symptoms he called EMS, on route he was in respiratory distress, he was placed on CPAP and received 125 mg of methylprednisolone intravenously along with magnesium and nebulized albuterol/ipratropium.  In the ED his physical examination showed a blood pressure 141/82, heart rate 136, respiratory rate 25-31, oxygen saturation 91% on supplemental oxygen per nasal cannula.  Heart S1-S2, present, tachycardic, lungs with poor ventilation, positive expiratory wheezing, no rales, abdomen soft, nontender, no lower extremity edema.  Venous blood gas pH 7.36, PCO2 44.0, bicarb 25.   Sodium 137, potassium 3.9, chloride 104, bicarb 24, glucose 115, BUN 15, creatinine 0.81, white count 13.8, hemoglobin 15.1, hematocrit 45.3, platelets 285. SARS COVID-19  negative. Toxicology screen positive for tetrahydrocannabinol.  Chest radiograph with significant hyperinflation.  1.  Acute hypoxemic respiratory failure due to COPD exacerbation.  Patient was admitted to the medical ward, he was placed on aggressive bronchodilator therapy, systemic steroids, antitussive agents, inhaled corticosteroids and airway clearing techniques with further improvement of his symptoms. Placed on oral azithromycin for airway inflammation.  At discharge no further wheezing, his oxygen saturation are 95% on room air.  2.  Hypertension/dyslipidemia continue blood pressure control with amlodipine. On statin therapy.  3.  Tobacco continue smoking cessation.  4.  Alcohol use/abuse, positive for THC.  Patient has no signs of alcohol withdrawal, he received as needed lorazepam hospitalization, along with thiamine and multivitamins.  5.  Reactive leukocytosis, no signs of bacterial infection, discharge white cell count 16.9.    Discharge Diagnoses:  Principal Problem:   COPD exacerbation (HCC) Active Problems:   Tobacco abuse   Essential hypertension   Acute respiratory failure with hypoxia (HCC)   COPD (chronic obstructive pulmonary disease) (HCC)    Discharge Instructions   Allergies as of 07/31/2020      Reactions   Dilaudid [hydromorphone Hcl] Hives      Medication List    STOP taking these medications   nicotine polacrilex 2 MG gum Commonly known as: NICORETTE     TAKE these medications   albuterol 108 (90 Base) MCG/ACT inhaler Commonly known as: VENTOLIN HFA Inhale 2 puffs into the lungs every 4 (four) hours as needed for wheezing or shortness of breath. What changed: Another medication with the same name was removed. Continue taking this medication, and follow the directions you see here.   amLODipine 5 MG tablet Commonly known as: NORVASC Take 1 tablet (5  mg total) by mouth daily.   atorvastatin 10 MG tablet Commonly known as:  LIPITOR Take 1 tablet (10 mg total) by mouth daily.   Fluticasone-Salmeterol 250-50 MCG/DOSE Aepb Commonly known as: Advair Diskus Inhale 1 puff into the lungs 2 (two) times daily.   ipratropium-albuterol 0.5-2.5 (3) MG/3ML Soln Commonly known as: DUONEB Take 3 mLs by nebulization every 6 (six) hours as needed (shortness of breath or wheezing.). What changed: reasons to take this   predniSONE 20 MG tablet Commonly known as: DELTASONE Take 1 tablet (20 mg total) by mouth daily with breakfast for 3 days. Start taking on: August 01, 2020 What changed:   medication strength  how much to take            Durable Medical Equipment  (From admission, onward)         Start     Ordered   07/31/20 0000  For home use only DME Nebulizer machine       Question Answer Comment  Patient needs a nebulizer to treat with the following condition COPD (chronic obstructive pulmonary disease) (HCC)   Length of Need 12 Months      07/31/20 1013          Allergies  Allergen Reactions  . Dilaudid [Hydromorphone Hcl] Hives       Procedures/Studies: DG Chest Port 1 View  Result Date: 07/29/2020 CLINICAL DATA:  Short of breath since this morning, COPD EXAM: PORTABLE CHEST 1 VIEW COMPARISON:  11/22/2019 FINDINGS: Two upright frontal views of the chest are obtained. Cardiac silhouette is unremarkable. No airspace disease, effusion, or pneumothorax. Stable emphysema. No acute bony abnormalities. IMPRESSION: 1. Emphysema.  No acute airspace disease. Electronically Signed   By: Sharlet Salina M.D.   On: 07/29/2020 17:06       Subjective: Patient is feeling better dyspnea has been improving, no chest pain, no nausea or vomiting.   Discharge Exam: Vitals:   07/31/20 0507 07/31/20 0721  BP: 101/75   Pulse: 90   Resp: 18   Temp: 98.3 F (36.8 C)   SpO2: 95% 95%   Vitals:   07/30/20 1940 07/30/20 2007 07/31/20 0507 07/31/20 0721  BP: 125/84  101/75   Pulse: (!) 110 (!) 109 90    Resp: 18 18 18    Temp: 99.4 F (37.4 C)  98.3 F (36.8 C)   TempSrc: Oral  Oral   SpO2: 94% 95% 95% 95%  Weight:   67.8 kg     General: Not in pain or dyspnea.  Neurology: Awake and alert, non focal  E ENT: no pallor, no icterus, oral mucosa moist Cardiovascular: No JVD. S1-S2 present, rhythmic, no gallops, rubs, or murmurs. No lower extremity edema. Pulmonary: positive breath sounds bilaterally, with no wheezing, rhonchi or rales. Gastrointestinal. Abdomen soft and non tender Skin. No rashes Musculoskeletal: no joint deformities   The results of significant diagnostics from this hospitalization (including imaging, microbiology, ancillary and laboratory) are listed below for reference.     Microbiology: Recent Results (from the past 240 hour(s))  Resp Panel by RT-PCR (Flu A&B, Covid) Nasopharyngeal Swab     Status: None   Collection Time: 07/29/20  4:43 PM   Specimen: Nasopharyngeal Swab; Nasopharyngeal(NP) swabs in vial transport medium  Result Value Ref Range Status   SARS Coronavirus 2 by RT PCR NEGATIVE NEGATIVE Final    Comment: (NOTE) SARS-CoV-2 target nucleic acids are NOT DETECTED.  The SARS-CoV-2 RNA is generally detectable in upper respiratory specimens during  the acute phase of infection. The lowest concentration of SARS-CoV-2 viral copies this assay can detect is 138 copies/mL. A negative result does not preclude SARS-Cov-2 infection and should not be used as the sole basis for treatment or other patient management decisions. A negative result may occur with  improper specimen collection/handling, submission of specimen other than nasopharyngeal swab, presence of viral mutation(s) within the areas targeted by this assay, and inadequate number of viral copies(<138 copies/mL). A negative result must be combined with clinical observations, patient history, and epidemiological information. The expected result is Negative.  Fact Sheet for Patients:   BloggerCourse.com  Fact Sheet for Healthcare Providers:  SeriousBroker.it  This test is no t yet approved or cleared by the [place] FDA and  has been authorized for detection and/or diagnosis of SARS-CoV-2 by FDA under an Emergency Use Authorization (EUA). This EUA will remain  in effect (meaning this test can be used) for the duration of the COVID-19 declaration under Section 564(b)(1) of the Act, 21 U.S.C.section 360bbb-3(b)(1), unless the authorization is terminated  or revoked sooner.       Influenza A by PCR NEGATIVE NEGATIVE Final   Influenza B by PCR NEGATIVE NEGATIVE Final    Comment: (NOTE) The Xpert Xpress SARS-CoV-2/FLU/RSV plus assay is intended as an aid in the diagnosis of influenza from Nasopharyngeal swab specimens and should not be used as a sole basis for treatment. Nasal washings and aspirates are unacceptable for Xpert Xpress SARS-CoV-2/FLU/RSV testing.  Fact Sheet for Patients: BloggerCourse.com  Fact Sheet for Healthcare Providers: SeriousBroker.it  This test is not yet approved or cleared by the [place] FDA and has been authorized for detection and/or diagnosis of SARS-CoV-2 by FDA under an Emergency Use Authorization (EUA). This EUA will remain in effect (meaning this test can be used) for the duration of the COVID-19 declaration under Section 564(b)(1) of the Act, 21 U.S.C. section 360bbb-3(b)(1), unless the authorization is terminated or revoked.  Performed at [facility], [address], [place], [place] [address]      Labs: BNP (last 3 results) No results for input(s): BNP in the last 8760 hours. Basic Metabolic Panel: Recent Labs  Lab 07/29/20 1643 07/29/20 1651 07/30/20 0233 07/31/20 0321  NA 137 138 136 132*  K 3.8 3.7 3.9 4.3  CL 104  --  104 105  CO2 24  --  17* 21*  GLUCOSE 115*  --  122* 126*  BUN 15  --  16  17  CREATININE 0.81  --  0.82 0.88  CALCIUM 9.3  --  9.1 9.3   Liver Function Tests: No results for input(s): AST, ALT, ALKPHOS, BILITOT, PROT, ALBUMIN in the last 168 hours. No results for input(s): LIPASE, AMYLASE in the last 168 hours. No results for input(s): AMMONIA in the last 168 hours. CBC: Recent Labs  Lab 07/29/20 1643 07/29/20 1651 07/30/20 0233 07/31/20 0321  WBC 13.8*  --  17.1* 16.9*  NEUTROABS 8.3*  --   --  15.6*  HGB 15.1 16.3 14.6 14.5  HCT 45.3 48.0 43.0 42.5  MCV 90.8  --  90.9 89.3  PLT 285  --  250 279   Cardiac Enzymes: No results for input(s): CKTOTAL, CKMB, CKMBINDEX, TROPONINI in the last 168 hours. BNP: Invalid input(s): POCBNP CBG: Recent Labs  Lab 07/30/20 0733  GLUCAP 120*   D-Dimer No results for input(s): DDIMER in the last 72 hours. Hgb A1c No results for input(s): HGBA1C in the last 72  hours. Lipid Profile No results for input(s): CHOL, HDL, LDLCALC, TRIG, CHOLHDL, LDLDIRECT in the last 72 hours. Thyroid function studies No results for input(s): TSH, T4TOTAL, T3FREE, THYROIDAB in the last 72 hours.  Invalid input(s): FREET3 Anemia work up No results for input(s): VITAMINB12, FOLATE, FERRITIN, TIBC, IRON, RETICCTPCT in the last 72 hours. Urinalysis No results found for: COLORURINE, APPEARANCEUR, LABSPEC, PHURINE, GLUCOSEU, HGBUR, BILIRUBINUR, KETONESUR, PROTEINUR, UROBILINOGEN, NITRITE, LEUKOCYTESUR Sepsis Labs Invalid input(s): PROCALCITONIN,  WBC,  LACTICIDVEN Microbiology Recent Results (from the past 240 hour(s))  Resp Panel by RT-PCR (Flu A&B, Covid) Nasopharyngeal Swab     Status: None   Collection Time: 07/29/20  4:43 PM   Specimen: Nasopharyngeal Swab; Nasopharyngeal(NP) swabs in vial transport medium  Result Value Ref Range Status   SARS Coronavirus 2 by RT PCR NEGATIVE NEGATIVE Final    Comment: (NOTE) SARS-CoV-2 target nucleic acids are NOT DETECTED.  The SARS-CoV-2 RNA is generally detectable in upper  respiratory specimens during the acute phase of infection. The lowest concentration of SARS-CoV-2 viral copies this assay can detect is 138 copies/mL. A negative result does not preclude SARS-Cov-2 infection and should not be used as the sole basis for treatment or other patient management decisions. A negative result may occur with  improper specimen collection/handling, submission of specimen other than nasopharyngeal swab, presence of viral mutation(s) within the areas targeted by this assay, and inadequate number of viral copies(<138 copies/mL). A negative result must be combined with clinical observations, patient history, and epidemiological information. The expected result is Negative.  Fact Sheet for Patients:  BloggerCourse.com  Fact Sheet for Healthcare Providers:  SeriousBroker.it  This test is no t yet approved or cleared by the [place] FDA and  has been authorized for detection and/or diagnosis of SARS-CoV-2 by FDA under an Emergency Use Authorization (EUA). This EUA will remain  in effect (meaning this test can be used) for the duration of the COVID-19 declaration under Section 564(b)(1) of the Act, 21 U.S.C.section 360bbb-3(b)(1), unless the authorization is terminated  or revoked sooner.       Influenza A by PCR NEGATIVE NEGATIVE Final   Influenza B by PCR NEGATIVE NEGATIVE Final    Comment: (NOTE) The Xpert Xpress SARS-CoV-2/FLU/RSV plus assay is intended as an aid in the diagnosis of influenza from Nasopharyngeal swab specimens and should not be used as a sole basis for treatment. Nasal washings and aspirates are unacceptable for Xpert Xpress SARS-CoV-2/FLU/RSV testing.  Fact Sheet for Patients: BloggerCourse.com  Fact Sheet for Healthcare Providers: SeriousBroker.it  This test is not yet approved or cleared by the [place] FDA and has been  authorized for detection and/or diagnosis of SARS-CoV-2 by FDA under an Emergency Use Authorization (EUA). This EUA will remain in effect (meaning this test can be used) for the duration of the COVID-19 declaration under Section 564(b)(1) of the Act, 21 U.S.C. section 360bbb-3(b)(1), unless the authorization is terminated or revoked.  Performed at [facility], [address], [place], [place] [address]      Time coordinating discharge: 45 minutes  SIGNED:   Coralie Keens, MD  [facility] 07/31/2020, 10:02 AM

## 2020-07-31 NOTE — Progress Notes (Signed)
D/C instructions given and reviewed. IV removed, tolerated well. Awaiting neb machine delivery.

## 2020-07-31 NOTE — TOC Transition Note (Signed)
Transition of Care [facility]) - CM/SW Discharge Note   Patient Details  Name: Jesse Walters MRN: [id number] Date of Birth: 08-08-1970  [facility] of [facility] CM/SW Contact:  Leone Haven, RN Phone Number: 07/31/2020, 11:47 AM   Clinical Narrative:    Patient is discharging today, will need nebulizer machine.  NCM made referral to [facility] with [employer] for neb machine.  This will be brought up to patient room.   Final next level of care: Home/Self Care Barriers to Discharge: No Barriers Identified   Patient Goals and CMS Choice        Discharge Placement                       Discharge Plan and Services                                     Social Determinants of Health (SDOH) Interventions     Readmission Risk Interventions No flowsheet data found.

## 2020-08-01 ENCOUNTER — Telehealth: Payer: Self-pay

## 2020-08-01 NOTE — Telephone Encounter (Signed)
Transition Care Management Unsuccessful Follow-up Telephone Call  Date of discharge and from where:  07/31/2020, [facility]   Attempts:  1st Attempt  Reason for unsuccessful TCM follow-up call:  Left voice message on # [phone].  Need to discuss scheduling a hospital follow up appointment at [facility]

## 2020-08-02 ENCOUNTER — Telehealth: Payer: Self-pay

## 2020-08-02 NOTE — Telephone Encounter (Signed)
Transition Care Management Unsuccessful Follow-up Telephone Call  Date of discharge and from where:  07/31/2020, [facility]   Attempts:  2nd Attempt  Reason for unsuccessful TCM follow-up call:  Left voice message on # [phone].   Need to discuss scheduling a hospital follow up appointment at [facility]

## 2020-08-03 ENCOUNTER — Telehealth: Payer: Self-pay

## 2020-08-03 NOTE — Telephone Encounter (Signed)
Transition Care Management Unsuccessful Follow-up Telephone Call  Date of discharge and from where:  07/31/2020, [facility]   Attempts:  3rd Attempt  Reason for unsuccessful TCM follow-up call:  Left voice message on # [phone].  Letter sent to patient requesting he contact [facility] to schedule a hospital follow up appointment with PCP

## 2020-08-05 ENCOUNTER — Other Ambulatory Visit: Payer: Self-pay

## 2020-08-08 ENCOUNTER — Ambulatory Visit: Payer: BC Managed Care – PPO | Admitting: Pharmacist

## 2020-09-05 ENCOUNTER — Other Ambulatory Visit: Payer: Self-pay | Admitting: Pharmacist

## 2020-09-05 ENCOUNTER — Other Ambulatory Visit: Payer: Self-pay

## 2020-09-05 ENCOUNTER — Other Ambulatory Visit: Payer: Self-pay | Admitting: Internal Medicine

## 2020-09-05 DIAGNOSIS — J449 Chronic obstructive pulmonary disease, unspecified: Secondary | ICD-10-CM

## 2020-09-05 MED ORDER — FLUTICASONE-SALMETEROL 250-50 MCG/ACT IN AEPB
1.0000 | INHALATION_SPRAY | Freq: Two times a day (BID) | RESPIRATORY_TRACT | 2 refills | Status: DC
  Filled 2020-09-05: qty 60, 30d supply, fill #0

## 2020-09-06 ENCOUNTER — Other Ambulatory Visit: Payer: Self-pay

## 2020-09-07 ENCOUNTER — Other Ambulatory Visit: Payer: Self-pay

## 2020-09-29 ENCOUNTER — Ambulatory Visit: Payer: BC Managed Care – PPO | Attending: Internal Medicine | Admitting: Internal Medicine

## 2020-09-29 ENCOUNTER — Other Ambulatory Visit: Payer: Self-pay

## 2020-09-29 ENCOUNTER — Encounter: Payer: Self-pay | Admitting: Internal Medicine

## 2020-09-29 VITALS — BP 160/100 | HR 92 | Resp 16 | Wt 163.8 lb

## 2020-09-29 DIAGNOSIS — I10 Essential (primary) hypertension: Secondary | ICD-10-CM

## 2020-09-29 DIAGNOSIS — F17200 Nicotine dependence, unspecified, uncomplicated: Secondary | ICD-10-CM

## 2020-09-29 DIAGNOSIS — J449 Chronic obstructive pulmonary disease, unspecified: Secondary | ICD-10-CM

## 2020-09-29 DIAGNOSIS — E782 Mixed hyperlipidemia: Secondary | ICD-10-CM

## 2020-09-29 DIAGNOSIS — Z5320 Procedure and treatment not carried out because of patient's decision for unspecified reasons: Secondary | ICD-10-CM

## 2020-09-29 DIAGNOSIS — Z2821 Immunization not carried out because of patient refusal: Secondary | ICD-10-CM

## 2020-09-29 MED ORDER — ALBUTEROL SULFATE HFA 108 (90 BASE) MCG/ACT IN AERS
2.0000 | INHALATION_SPRAY | RESPIRATORY_TRACT | 11 refills | Status: DC | PRN
  Filled 2020-09-29 (×2): qty 18, 16d supply, fill #0
  Filled 2020-11-21: qty 18, 16d supply, fill #1
  Filled 2021-01-24: qty 18, 16d supply, fill #2
  Filled 2021-02-23: qty 18, 16d supply, fill #3
  Filled 2021-03-14: qty 18, 16d supply, fill #4

## 2020-09-29 MED ORDER — IPRATROPIUM-ALBUTEROL 0.5-2.5 (3) MG/3ML IN SOLN
RESPIRATORY_TRACT | 6 refills | Status: AC
  Filled 2020-09-29: qty 360, 30d supply, fill #0

## 2020-09-29 MED ORDER — FLUTICASONE-SALMETEROL 250-50 MCG/ACT IN AEPB
1.0000 | INHALATION_SPRAY | Freq: Two times a day (BID) | RESPIRATORY_TRACT | 12 refills | Status: DC
  Filled 2020-09-29: qty 60, 30d supply, fill #0
  Filled 2020-11-21: qty 60, 30d supply, fill #1
  Filled 2021-01-24: qty 60, 30d supply, fill #2
  Filled 2021-02-23: qty 60, 30d supply, fill #3
  Filled 2021-03-21: qty 60, 30d supply, fill #4

## 2020-09-29 NOTE — Patient Instructions (Signed)

## 2020-09-29 NOTE — Progress Notes (Signed)
Patient ID: Jesse Walters, male    DOB: April 27, 1971  MRN: [id number]  CC: Hypertension, COPD, and Medication Refill   Subjective: Jesse Walters is a 50 y.o. male who presents for chronic ds management His concerns today include:  Patient with history of COPD, HTN, HL and tobacco dependence  COPD:  hosp 07/2020 with severe exacerbation.  Patient states he was having a flareup and his nebulizer machine was not working.  He did get a nebulizer machine at the time that he left the hospital.  Since then he reports he has been doing good in terms of his breathing.  He is needing refill on Advair and albuterol inhalers. Using Advair BID.  Out of Albuterol inhaler since hosp.  Doing treatments BID. -no increase cough or SOB -still smoking 1/2 pk a day.  Reports nicotine gum made him sick. Marland Kitchen He realizes smoking adversely affects his breathing but not ready to quit. -does not want COVID vaccine  HTN: not taking Norvasc.  Reports his stomach hurts when he takes it and "I don't like taking a whole lot of pills and God say if it ain't broke don't fix it."  Also not taking Lipitor. "I just don't like taking pills.  "The only thing really wrong with me is COPD." "I don't eat right.  I'm at fast foods restaurants all the time."  HM:  Still has not turn in FIT test.  Not interested in colon CA screen using any methods.  Declines Tdap and Zoster vac.  Declines COVID-19 vaccine. Patient Active Problem List   Diagnosis Date Noted  . Colon cancer screening declined 09/29/2020  . COPD (chronic obstructive pulmonary disease) (HCC) 07/30/2020  . COPD exacerbation (HCC) 07/29/2020  . Acute respiratory failure with hypoxia (HCC) 07/29/2020  . Tachycardia 07/29/2020  . Mixed hyperlipidemia 06/27/2020  . Influenza vaccine refused 06/26/2020  . COVID-19 vaccination declined 06/26/2020  . Essential hypertension 11/26/2019  . Tobacco dependence 07/09/2019  . Tobacco abuse 07/02/2019  . Chronic obstructive pulmonary  disease (HCC) 07/02/2019     No current outpatient medications on file prior to visit.   No current facility-administered medications on file prior to visit.    Allergies  Allergen Reactions  . Dilaudid [Hydromorphone Hcl] Hives    Social History   Socioeconomic History  . Marital status: Single    Spouse name: Not on file  . Number of children: Not on file  . Years of education: Not on file  . Highest education level: Not on file  Occupational History  . Not on file  Tobacco Use  . Smoking status: Current Every Day Smoker    Packs/day: 0.50    Types: Cigarettes  . Smokeless tobacco: Never Used  . Tobacco comment: trying to quit  Vaping Use  . Vaping Use: Never used  Substance and Sexual Activity  . Alcohol use: Yes    Alcohol/week: 7.0 standard drinks    Types: 7 Cans of beer per week  . Drug use: Yes    Types: Marijuana  . Sexual activity: Not on file  Other Topics Concern  . Not on file  Social History Narrative  . Not on file   Social Determinants of Health   Financial Resource Strain: Not on file  Food Insecurity: Not on file  Transportation Needs: Not on file  Physical Activity: Not on file  Stress: Not on file  Social Connections: Not on file  Intimate Partner Violence: Not on file    Family History  Problem Relation Age of Onset  . Cancer Mother     Past Surgical History:  Procedure Laterality Date  . HERNIA REPAIR      ROS: Review of Systems Negative except as stated above  PHYSICAL EXAM: BP (!) 160/100   Pulse 92   Resp 16   Wt 163 lb 12.8 oz (74.3 kg)   SpO2 99%   BMI 23.50 kg/m   Physical Exam Repeat blood pressure 160/100  General appearance - alert, well appearing, and in no distress Mental status -patient is alert and oriented.  He lacks insight into his medical issues.  Neck - supple, no significant adenopathy Chest -breath sounds slightly decreased bilaterally without wheezes or crackles or rhonchi's. Heart - normal  rate, regular rhythm, normal S1, S2, no murmurs, rubs, clicks or gallops Extremities -no lower extremity edema CMP Latest Ref Rng & Units 07/31/2020 07/30/2020 07/29/2020  Glucose 70 - 99 mg/dL 384(Y) 659(D) -  BUN 6 - 20 mg/dL 17 16 -  Creatinine 3.57 - 1.24 mg/dL 0.17 7.93 -  Sodium 903 - 145 mmol/L 132(L) 136 138  Potassium 3.5 - 5.1 mmol/L 4.3 3.9 3.7  Chloride 98 - 111 mmol/L 105 104 -  CO2 22 - 32 mmol/L 21(L) 17(L) -  Calcium 8.9 - 10.3 mg/dL 9.3 9.1 -  Total Protein 6.0 - 8.5 g/dL - - -  Total Bilirubin 0.0 - 1.2 mg/dL - - -  Alkaline Phos 44 - 121 IU/L - - -  AST 0 - 40 IU/L - - -  ALT 0 - 44 IU/L - - -   Lipid Panel     Component Value Date/Time   CHOL 212 (H) 06/26/2020 1444   TRIG 87 06/26/2020 1444   HDL 58 06/26/2020 1444   CHOLHDL 3.7 06/26/2020 1444   LDLCALC 139 (H) 06/26/2020 1444    CBC    Component Value Date/Time   WBC 16.9 (H) 07/31/2020 0321   RBC 4.76 07/31/2020 0321   HGB 14.5 07/31/2020 0321   HCT 42.5 07/31/2020 0321   PLT 279 07/31/2020 0321   MCV 89.3 07/31/2020 0321   MCH 30.5 07/31/2020 0321   MCHC 34.1 07/31/2020 0321   RDW 13.6 07/31/2020 0321   LYMPHSABS 0.9 07/31/2020 0321   MONOABS 0.4 07/31/2020 0321   EOSABS 0.0 07/31/2020 0321   BASOSABS 0.0 07/31/2020 0321    ASSESSMENT AND PLAN: 1. Chronic obstructive pulmonary disease, unspecified COPD type (HCC) Breathing seems stable at this time. Refill given on inhalers and nebulizer treatments Strongly encouraged smoking cessation - albuterol (VENTOLIN HFA) 108 (90 Base) MCG/ACT inhaler; INHALE 2 PUFFS INTO THE LUNGS EVERY 4 (FOUR) HOURS AS NEEDED FOR WHEEZING OR SHORTNESS OF BREATH.  Dispense: 18 g; Refill: 11 - fluticasone-salmeterol (ADVAIR DISKUS) 250-50 MCG/ACT AEPB; Inhale 1 puff into the lungs in the morning and at bedtime.  Dispense: 60 each; Refill: 12 - ipratropium-albuterol (DUONEB) 0.5-2.5 (3) MG/3ML SOLN; TAKE 3 MLS BY NEBULIZATION EVERY 6 (SIX) HOURS AS NEEDED (SHORTNESS OF  BREATH OR WHEEZING.).  Dispense: 360 mL; Refill: 6  2. Essential hypertension Discussed cardiovascular health risks associated with uncontrolled blood pressure.  Since he reports GI upset with Norvasc, I recommend that we try a different antihypertensive.  Patient declined stating that he probably will not take it anyway.  Encouraged low-salt diet.  3. Tobacco dependence Encourage smoking cessation.  Discussed ongoing health risks associated with smoking including worsening of his COPD.  Patient not ready to give a trial of quitting.  4. Mixed hyperlipidemia Patient declines statin  5. Colon cancer screening declined Discussed colon cancer screening.  He was given fit test in the past but never turned it in.  Today he tells me he is not interested in doing any screening  6. Tetanus, diphtheria, and acellular pertussis (Tdap) vaccination declined Recommended.  Patient declined.  7. COVID-19 vaccination declined Recommended.  Patient declined   Patient was given the opportunity to ask questions.  Patient verbalized understanding of the plan and was able to repeat key elements of the plan.   No orders of the defined types were placed in this encounter.    Requested Prescriptions   Signed Prescriptions Disp Refills  . albuterol (VENTOLIN HFA) 108 (90 Base) MCG/ACT inhaler 18 g 11    Sig: INHALE 2 PUFFS INTO THE LUNGS EVERY 4 (FOUR) HOURS AS NEEDED FOR WHEEZING OR SHORTNESS OF BREATH.  . fluticasone-salmeterol (ADVAIR DISKUS) 250-50 MCG/ACT AEPB 60 each 12    Sig: Inhale 1 puff into the lungs in the morning and at bedtime.  Marland Kitchen ipratropium-albuterol (DUONEB) 0.5-2.5 (3) MG/3ML SOLN 360 mL 6    Sig: TAKE 3 MLS BY NEBULIZATION EVERY 6 (SIX) HOURS AS NEEDED (SHORTNESS OF BREATH OR WHEEZING.).    Return in about 4 months (around 01/30/2021).  Jonah Blue, MD, FACP

## 2020-10-03 ENCOUNTER — Other Ambulatory Visit: Payer: Self-pay

## 2020-10-09 ENCOUNTER — Other Ambulatory Visit (HOSPITAL_COMMUNITY): Payer: Self-pay

## 2020-10-24 ENCOUNTER — Ambulatory Visit: Payer: BC Managed Care – PPO | Admitting: Internal Medicine

## 2020-11-21 ENCOUNTER — Ambulatory Visit (HOSPITAL_COMMUNITY)
Admission: EM | Admit: 2020-11-21 | Discharge: 2020-11-21 | Disposition: A | Discharge status: Home / Self Care | Payer: BC Managed Care – PPO

## 2020-11-21 ENCOUNTER — Other Ambulatory Visit: Payer: Self-pay

## 2020-11-21 ENCOUNTER — Encounter (HOSPITAL_COMMUNITY): Payer: Self-pay

## 2020-11-21 NOTE — ED Notes (Signed)
Attempted to call pt with no answer on phone.

## 2020-11-21 NOTE — ED Triage Notes (Signed)
Pt presents with headache, fever, abdominal pain x 4 days.

## 2020-11-21 NOTE — ED Notes (Signed)
Called pt in lobby and outside, no answer.

## 2020-12-31 IMAGING — DX DG CHEST 1V PORT
2 series · 2 of 2 positions shown · non-contrast
Comparison: June 02, 2019

CLINICAL DATA: Cough and shortness of breath

EXAM:
PORTABLE CHEST 1 VIEW

[chest ap (1 of 2)]
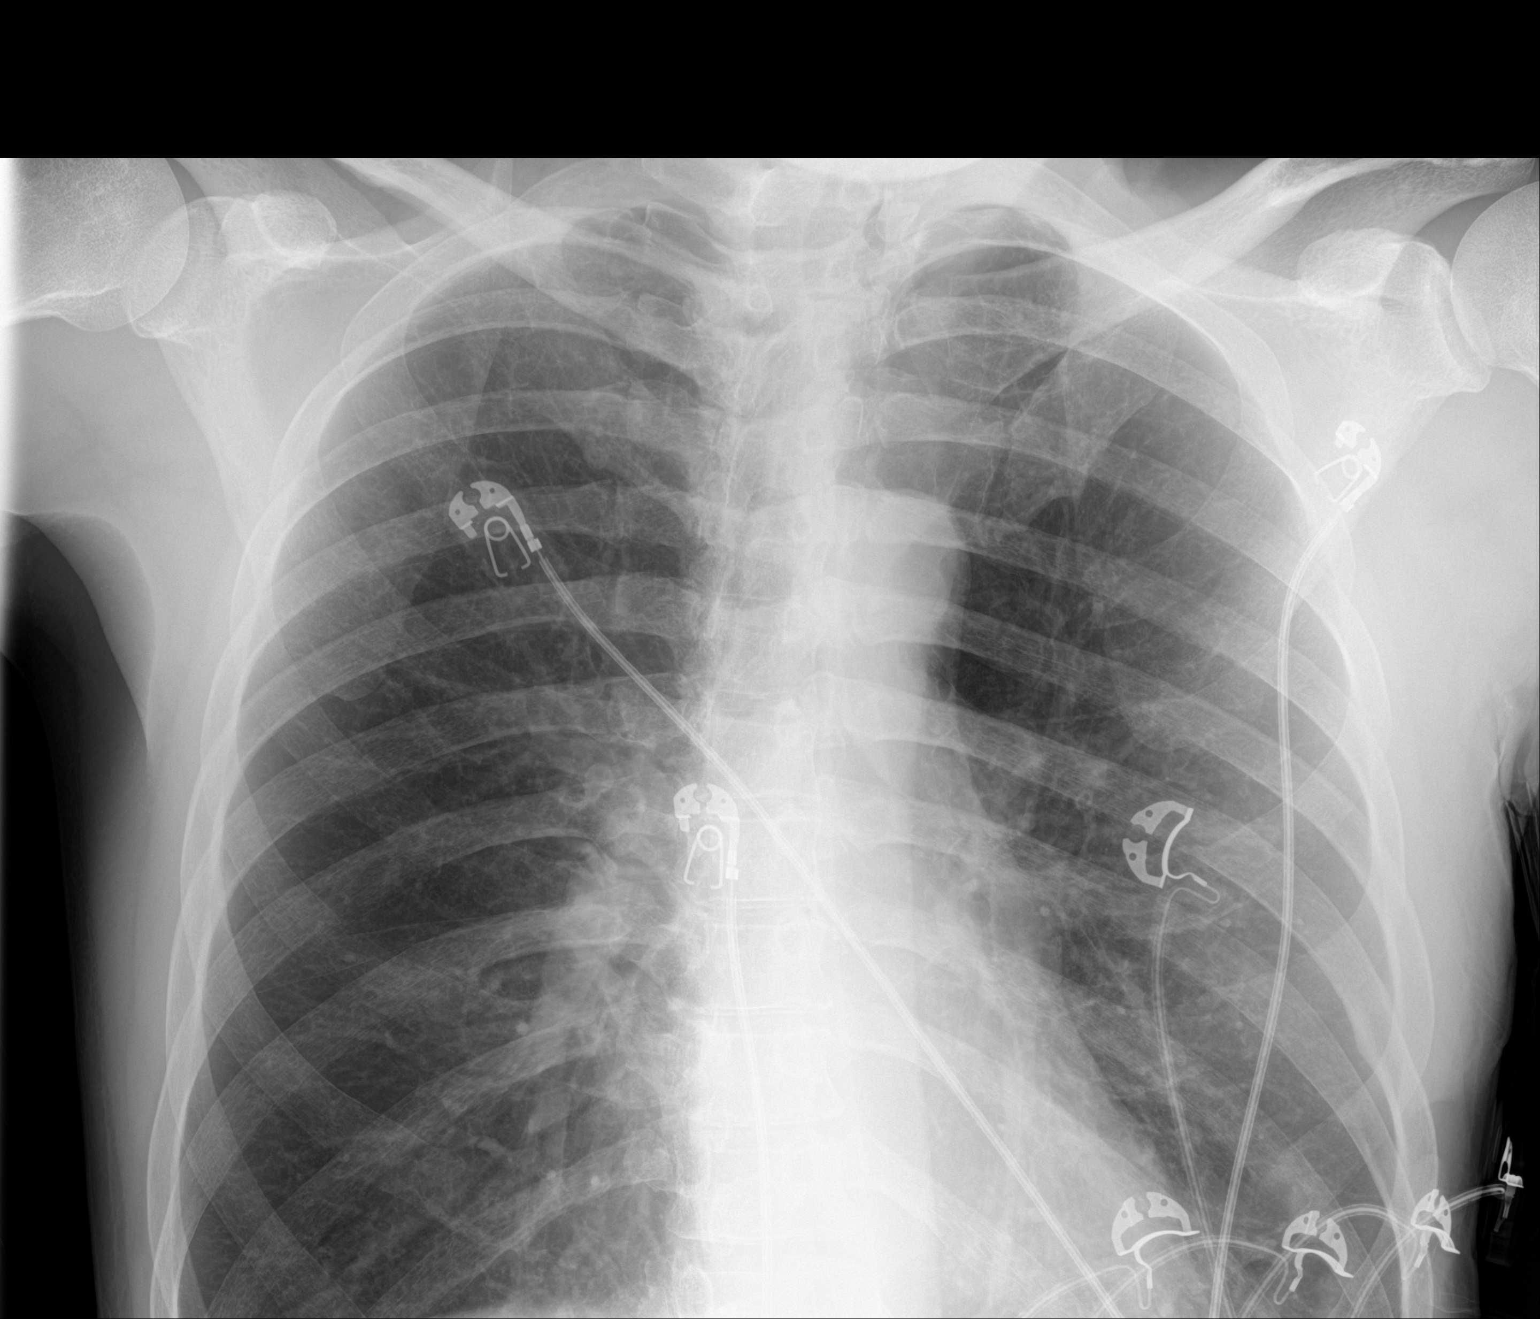

[chest ap (2 of 2)]
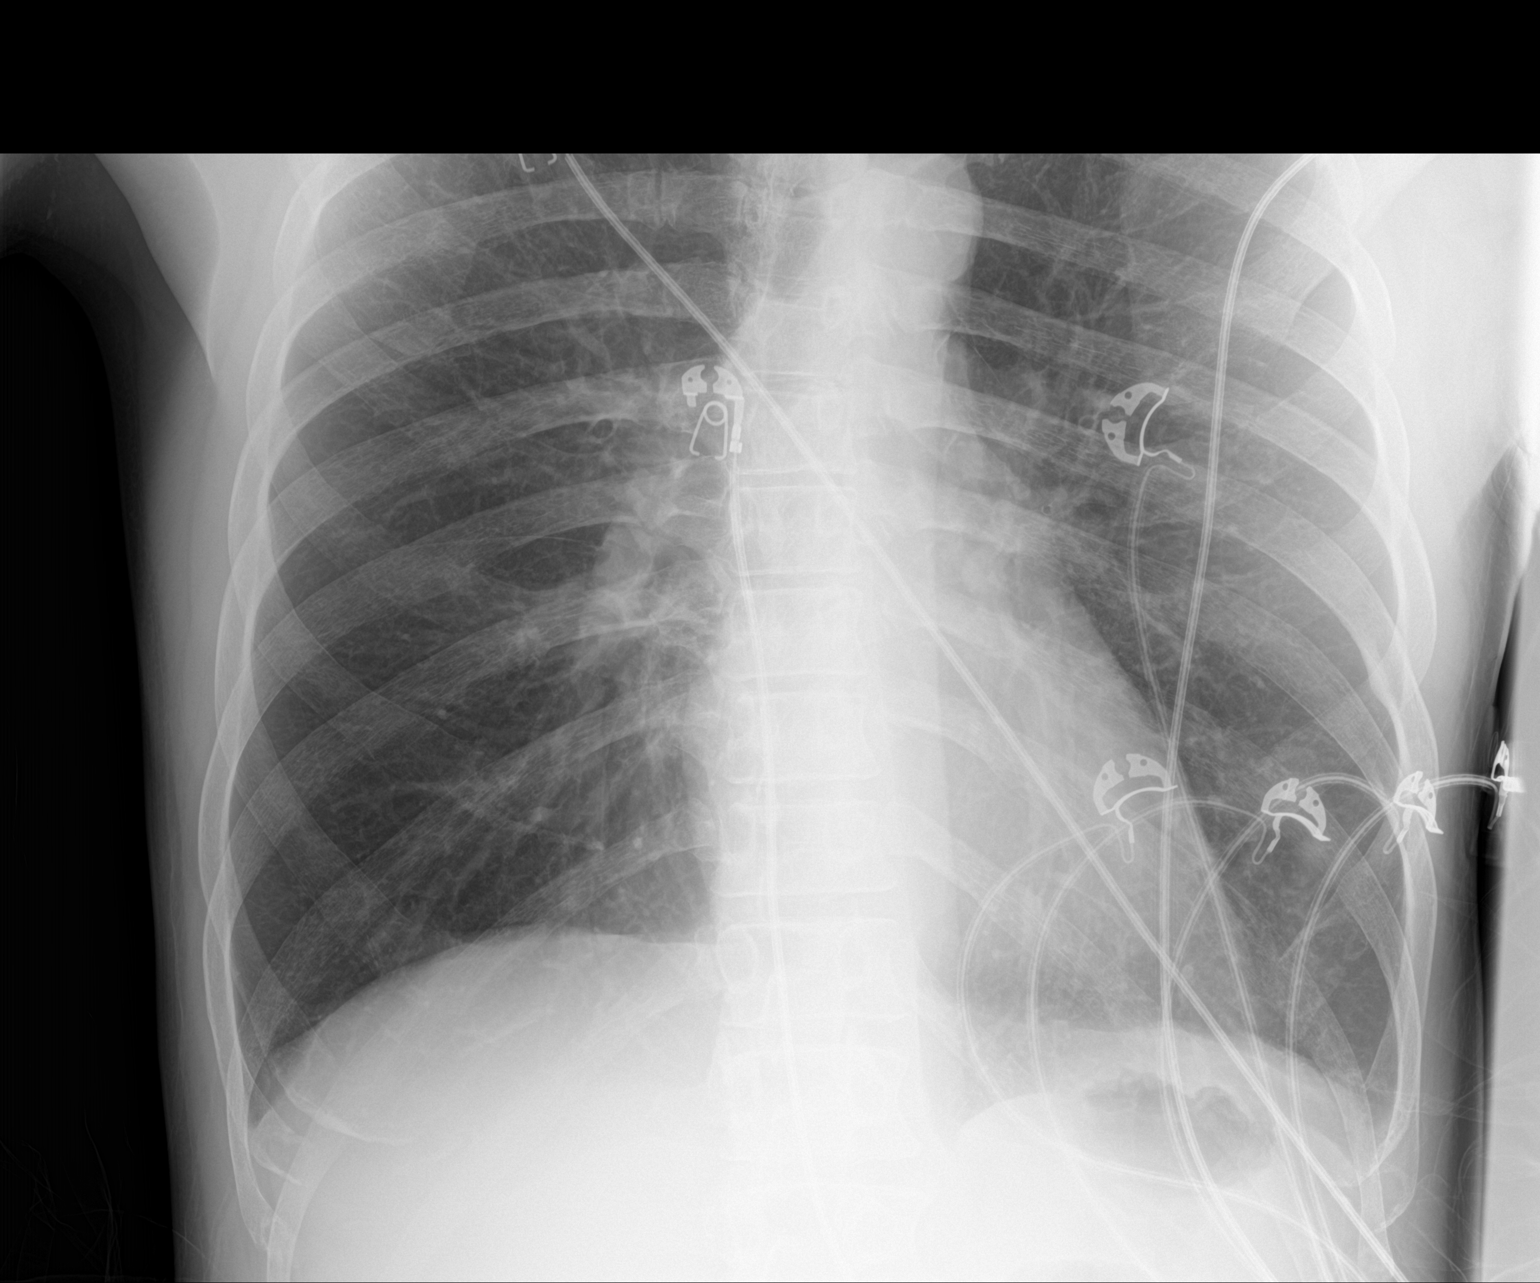

[2 of 2 positions shown; findings below may reference images not displayed]

FINDINGS: Lungs are mildly hyperexpanded but clear. Heart size and pulmonary
vascularity are normal. No adenopathy. No bone lesions.
IMPRESSION: Lungs mildly hyperexpanded but clear.  Stable cardiac silhouette.

## 2021-01-14 IMAGING — DX DG CHEST 1V PORT
1 series · 2 of 2 positions shown · non-contrast
Comparison: 06/26/2019

CLINICAL DATA: Cough, shortness of breath, hypoxia

EXAM:
PORTABLE CHEST 1 VIEW

[Series 1: chest ap · 0.14mm/px · 2 of 2 slices shown]
[im 1/2]
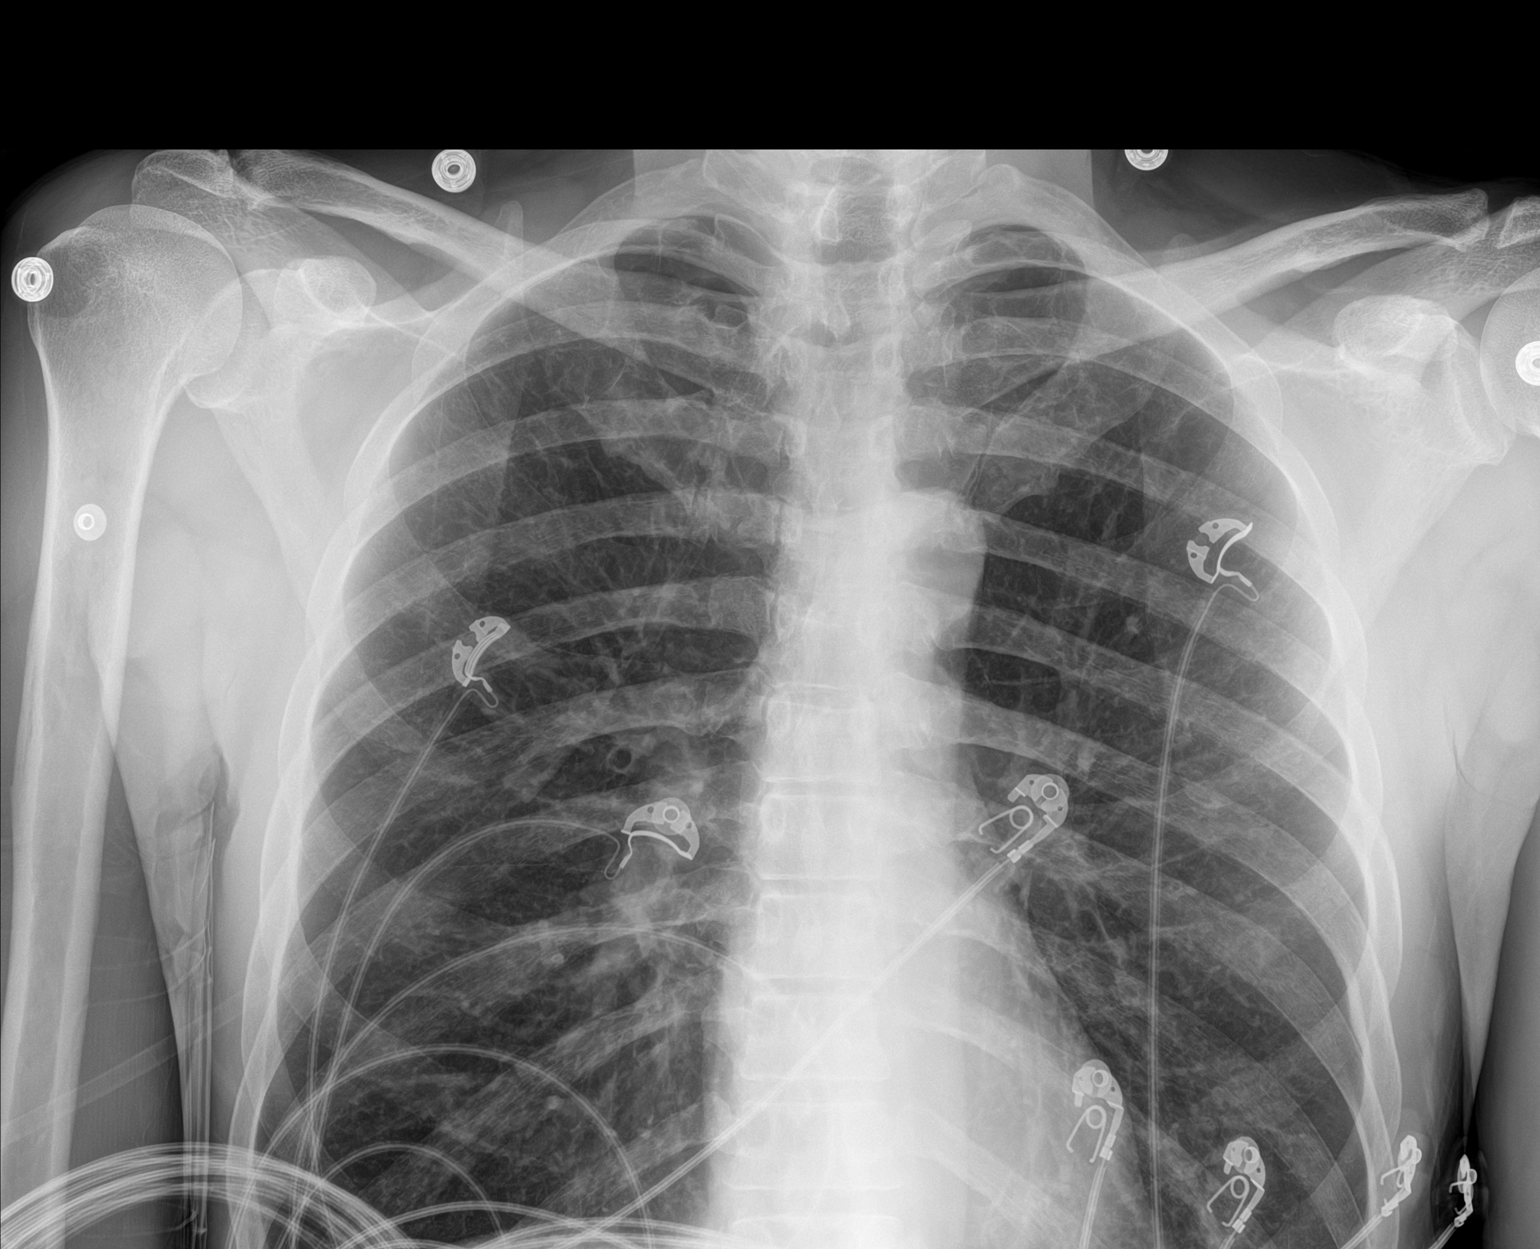
[im 2/2]
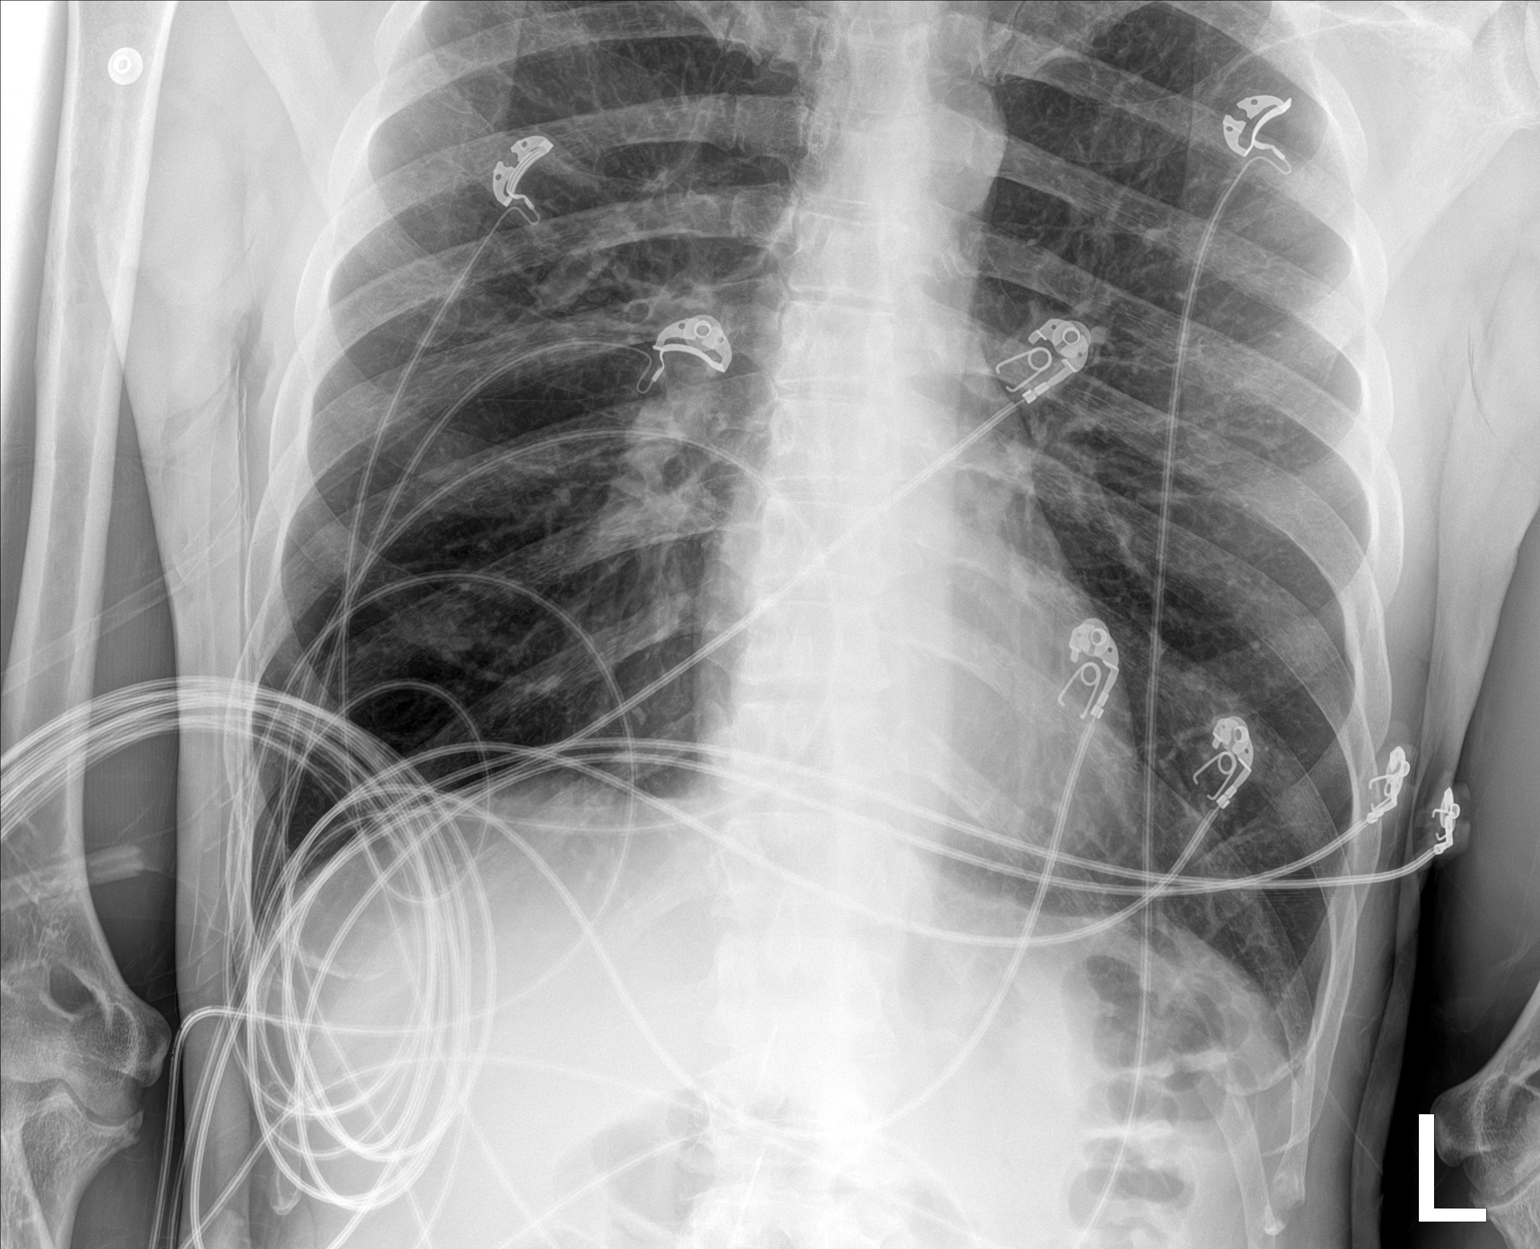

[2 of 2 positions shown; findings below may reference images not displayed]

FINDINGS: The heart size and mediastinal contours are within normal limits.
Both lungs are clear. The visualized skeletal structures are
unremarkable.
IMPRESSION: No acute abnormality of the lungs in AP portable projection.

## 2021-01-16 ENCOUNTER — Other Ambulatory Visit (HOSPITAL_COMMUNITY): Payer: Self-pay

## 2021-01-24 ENCOUNTER — Other Ambulatory Visit: Payer: Self-pay

## 2021-02-23 ENCOUNTER — Other Ambulatory Visit: Payer: Self-pay

## 2021-03-14 ENCOUNTER — Other Ambulatory Visit: Payer: Self-pay

## 2021-03-21 ENCOUNTER — Other Ambulatory Visit: Payer: Self-pay

## 2021-12-19 ENCOUNTER — Other Ambulatory Visit: Payer: Self-pay

## 2021-12-19 ENCOUNTER — Ambulatory Visit (HOSPITAL_COMMUNITY)
Admission: EM | Admit: 2021-12-19 | Discharge: 2021-12-19 | Disposition: A | Discharge status: Home / Self Care | Payer: BC Managed Care – PPO | Attending: Internal Medicine | Admitting: Internal Medicine

## 2021-12-19 ENCOUNTER — Encounter (HOSPITAL_COMMUNITY): Payer: Self-pay | Admitting: Emergency Medicine

## 2021-12-19 DIAGNOSIS — L723 Sebaceous cyst: Secondary | ICD-10-CM | POA: Insufficient documentation

## 2021-12-19 MED ORDER — LIDOCAINE-EPINEPHRINE 1 %-1:100000 IJ SOLN
INTRAMUSCULAR | Status: AC
  Filled 2021-12-19: qty 1

## 2021-12-19 MED ORDER — DOXYCYCLINE HYCLATE 100 MG PO TABS
100.0000 mg | ORAL_TABLET | Freq: Two times a day (BID) | ORAL | 0 refills | Status: AC
  Filled 2021-12-19: qty 14, 7d supply, fill #0

## 2021-12-19 NOTE — Discharge Instructions (Addendum)
You had a sebaceous cyst on your face We drained it today I will send the fluid to the lab for analysis Take the doxyxycline antibiotic twice per day for 7 days We will call you if there was bacteria resistant to the antibiotic

## 2021-12-19 NOTE — ED Provider Notes (Signed)
[facility]   [id number] 12/19/21 Arrival Time: 0848  ASSESSMENT & PLAN:  1. Sebaceous cyst    -Patient presents with 4 days of enlarging sebaceous cyst on face.  The cyst was drained in the office today.  See procedure note for details.  Wound culture was collected and sent to the lab.  Patient was started on empiric doxycycline x7 days.  We will call with culture results if resistant.  All questions were answered.   Procedure Note L Face Sebaceous Cyst I&D  Diagnosis: epidermal inclusion cyst - Location: Face Procedure: Incision & drainage Informed consent:  Discussed risks and benefits of the procedure, as well as the alternatives.  Informed consent was obtained. Anesthesia: Local, 1% Lidocaine w/ Epi  The area was prepared and draped in a standard fashion. The lesion drained pus, white, chalky, cyst material, and blood. Pieces of cyst wall were extracted. The patient tolerated the procedure well. The patient was instructed on post-op care.    Meds ordered this encounter  Medications   doxycycline (VIBRAMYCIN) 100 MG capsule    Sig: Take 1 capsule (100 mg total) by mouth 2 (two) times daily for 7 days.    Dispense:  14 capsule    Refill:  0     Discharge Instructions      You had a sebaceous cyst on your face We drained it today I will send the fluid to the lab for analysis Take the doxyxycline antibiotic twice per day for 7 days We will call you if there was bacteria resistant to the antibiotic      Follow-up Information     Marcine Matar, MD.   Specialty: Internal Medicine Why: If symptoms worsen Contact information: [address] [place] [address] [phone]                  Reviewed expectations re: course of current medical issues. Questions answered. Outlined signs and symptoms indicating need for more acute intervention. Patient verbalized understanding. After Visit Summary  given.   SUBJECTIVE: Patient is here for evaluation of swelling on face.  He says he had a lump on his left cheek for a number of months but over the weekend he was messing with it.  On Saturday he woke up and the left side of his cheek had swollen up significantly.  He denies any other trauma or cuts to the skin.  He denies any tooth infections or tooth pain.  He denies any throat swelling.  This is never happened before.  He has never taken any antibiotics.  No other skin concerns today.  No LMP for male patient. Past Surgical History:  Procedure Laterality Date   HERNIA REPAIR       OBJECTIVE:  Vitals:   12/19/21 0907  BP: 110/77  Pulse: 92  Resp: 14  Temp: 98.3 F (36.8 C)  SpO2: 96%     Physical Exam Vitals reviewed.  HENT:     Head:      Mouth/Throat:     Mouth: No oral lesions.     Dentition: Normal dentition. No dental caries.  Cardiovascular:     Rate and Rhythm: Normal rate.  Pulmonary:     Effort: Pulmonary effort is normal.  Musculoskeletal:        General: Normal range of motion.  Skin:    Findings: Abscess present.  Neurological:     General: No focal deficit present.     Mental Status: He is  alert.  Psychiatric:        Mood and Affect: Mood normal.      Labs: Results for orders placed or performed during the hospital encounter of 07/29/20  Resp Panel by RT-PCR (Flu A&B, Covid) Nasopharyngeal Swab   Specimen: Nasopharyngeal Swab; Nasopharyngeal(NP) swabs in vial transport medium  Result Value Ref Range   SARS Coronavirus 2 by RT PCR NEGATIVE NEGATIVE   Influenza A by PCR NEGATIVE NEGATIVE   Influenza B by PCR NEGATIVE NEGATIVE  Basic metabolic panel  Result Value Ref Range   Sodium 137 135 - 145 mmol/L   Potassium 3.8 3.5 - 5.1 mmol/L   Chloride 104 98 - 111 mmol/L   CO2 24 22 - 32 mmol/L   Glucose, Bld 115 (H) 70 - 99 mg/dL   BUN 15 6 - 20 mg/dL   Creatinine, Ser 6.60 0.61 - 1.24 mg/dL   Calcium 9.3 8.9 - 60.0 mg/dL   GFR, Estimated  >45 >99 mL/min   Anion gap 9 5 - 15  CBC with Differential  Result Value Ref Range   WBC 13.8 (H) 4.0 - 10.5 K/uL   RBC 4.99 4.22 - 5.81 MIL/uL   Hemoglobin 15.1 13.0 - 17.0 g/dL   HCT 77.4 14.2 - 39.5 %   MCV 90.8 80.0 - 100.0 fL   MCH 30.3 26.0 - 34.0 pg   MCHC 33.3 30.0 - 36.0 g/dL   RDW 32.0 23.3 - 43.5 %   Platelets 285 150 - 400 K/uL   nRBC 0.0 0.0 - 0.2 %   Neutrophils Relative % 60 %   Neutro Abs 8.3 (H) 1.7 - 7.7 K/uL   Lymphocytes Relative 28 %   Lymphs Abs 3.8 0.7 - 4.0 K/uL   Monocytes Relative 7 %   Monocytes Absolute 1.0 0.1 - 1.0 K/uL   Eosinophils Relative 4 %   Eosinophils Absolute 0.6 (H) 0.0 - 0.5 K/uL   Basophils Relative 1 %   Basophils Absolute 0.1 0.0 - 0.1 K/uL   Immature Granulocytes 0 %   Abs Immature Granulocytes 0.04 0.00 - 0.07 K/uL  HIV Antibody (routine testing w rflx)  Result Value Ref Range   HIV Screen 4th Generation wRfx Non Reactive Non Reactive  Basic metabolic panel  Result Value Ref Range   Sodium 136 135 - 145 mmol/L   Potassium 3.9 3.5 - 5.1 mmol/L   Chloride 104 98 - 111 mmol/L   CO2 17 (L) 22 - 32 mmol/L   Glucose, Bld 122 (H) 70 - 99 mg/dL   BUN 16 6 - 20 mg/dL   Creatinine, Ser 6.86 0.61 - 1.24 mg/dL   Calcium 9.1 8.9 - 16.8 mg/dL   GFR, Estimated >37 >29 mL/min   Anion gap 15 5 - 15  CBC  Result Value Ref Range   WBC 17.1 (H) 4.0 - 10.5 K/uL   RBC 4.73 4.22 - 5.81 MIL/uL   Hemoglobin 14.6 13.0 - 17.0 g/dL   HCT 02.1 11.5 - 52.0 %   MCV 90.9 80.0 - 100.0 fL   MCH 30.9 26.0 - 34.0 pg   MCHC 34.0 30.0 - 36.0 g/dL   RDW 80.2 23.3 - 61.2 %   Platelets 250 150 - 400 K/uL   nRBC 0.0 0.0 - 0.2 %  Urine rapid drug screen (hosp performed)  Result Value Ref Range   Opiates NONE DETECTED NONE DETECTED   Cocaine NONE DETECTED NONE DETECTED   Benzodiazepines NONE DETECTED NONE DETECTED   Amphetamines  NONE DETECTED NONE DETECTED   Tetrahydrocannabinol POSITIVE (A) NONE DETECTED   Barbiturates NONE DETECTED NONE DETECTED   Glucose, capillary  Result Value Ref Range   Glucose-Capillary 120 (H) 70 - 99 mg/dL   Comment 1 Notify RN    Comment 2 Document in Chart   Basic metabolic panel  Result Value Ref Range   Sodium 132 (L) 135 - 145 mmol/L   Potassium 4.3 3.5 - 5.1 mmol/L   Chloride 105 98 - 111 mmol/L   CO2 21 (L) 22 - 32 mmol/L   Glucose, Bld 126 (H) 70 - 99 mg/dL   BUN 17 6 - 20 mg/dL   Creatinine, Ser 0.88 0.61 - 1.24 mg/dL   Calcium 9.3 8.9 - 10.3 mg/dL   GFR, Estimated >60 >60 mL/min   Anion gap 6 5 - 15  CBC with Differential/Platelet  Result Value Ref Range   WBC 16.9 (H) 4.0 - 10.5 K/uL   RBC 4.76 4.22 - 5.81 MIL/uL   Hemoglobin 14.5 13.0 - 17.0 g/dL   HCT 42.5 39.0 - 52.0 %   MCV 89.3 80.0 - 100.0 fL   MCH 30.5 26.0 - 34.0 pg   MCHC 34.1 30.0 - 36.0 g/dL   RDW 13.6 11.5 - 15.5 %   Platelets 279 150 - 400 K/uL   nRBC 0.0 0.0 - 0.2 %   Neutrophils Relative % 92 %   Neutro Abs 15.6 (H) 1.7 - 7.7 K/uL   Lymphocytes Relative 5 %   Lymphs Abs 0.9 0.7 - 4.0 K/uL   Monocytes Relative 2 %   Monocytes Absolute 0.4 0.1 - 1.0 K/uL   Eosinophils Relative 0 %   Eosinophils Absolute 0.0 0.0 - 0.5 K/uL   Basophils Relative 0 %   Basophils Absolute 0.0 0.0 - 0.1 K/uL   Immature Granulocytes 1 %   Abs Immature Granulocytes 0.10 (H) 0.00 - 0.07 K/uL  I-Stat venous blood gas, [facility] ED only)  Result Value Ref Range   pH, Ven 7.363 7.250 - 7.430   pCO2, Ven 44.0 44.0 - 60.0 mmHg   pO2, Ven 57.0 (H) 32.0 - 45.0 mmHg   Bicarbonate 25.0 20.0 - 28.0 mmol/L   TCO2 26 22 - 32 mmol/L   O2 Saturation 88.0 %   Acid-base deficit 1.0 0.0 - 2.0 mmol/L   Sodium 138 135 - 145 mmol/L   Potassium 3.7 3.5 - 5.1 mmol/L   Calcium, Ion 1.14 (L) 1.15 - 1.40 mmol/L   HCT 48.0 39.0 - 52.0 %   Hemoglobin 16.3 13.0 - 17.0 g/dL   Sample type CAPILLARY    Labs Reviewed  AEROBIC CULTURE W GRAM STAIN (SUPERFICIAL SPECIMEN)    Imaging: No results found.   Allergies  Allergen Reactions   Dilaudid [Hydromorphone  Hcl] Hives                                               Past Medical History:  Diagnosis Date   COPD (chronic obstructive pulmonary disease) (HCC)    COPD (chronic obstructive pulmonary disease) (HCC)    Hypertension    Seasonal allergic rhinitis     Social History   Socioeconomic History   Marital status: Single    Spouse name: Not on file   Number of children: Not on file   Years of education: Not on file   Highest  education level: Not on file  Occupational History   Not on file  Tobacco Use   Smoking status: Every Day    Packs/day: 0.50    Types: Cigarettes   Smokeless tobacco: Never   Tobacco comments:    trying to quit  Vaping Use   Vaping Use: Never used  Substance and Sexual Activity   Alcohol use: Yes    Alcohol/week: 7.0 standard drinks of alcohol    Types: 7 Cans of beer per week   Drug use: Yes    Types: Marijuana   Sexual activity: Not on file  Other Topics Concern   Not on file  Social History Narrative   Not on file   Social Determinants of Health   Financial Resource Strain: Not on file  Food Insecurity: Not on file  Transportation Needs: Not on file  Physical Activity: Not on file  Stress: Not on file  Social Connections: Not on file  Intimate Partner Violence: Not on file    Family History  Problem Relation Age of Onset   Cancer Mother       Arvella Nigh, MD 12/19/21 1046

## 2021-12-19 NOTE — ED Triage Notes (Signed)
Pt c/o left facial swelling that got worse on Saturday. Tried to pop and get drainage out himself with success.

## 2021-12-21 LAB — AEROBIC CULTURE W GRAM STAIN (SUPERFICIAL SPECIMEN)

## 2021-12-26 ENCOUNTER — Other Ambulatory Visit: Payer: Self-pay

## 2023-02-05 ENCOUNTER — Ambulatory Visit (HOSPITAL_COMMUNITY)
Admission: EM | Admit: 2023-02-05 | Discharge: 2023-02-05 | Disposition: A | Discharge status: Home / Self Care | Payer: BLUE CROSS/BLUE SHIELD | Attending: Emergency Medicine | Admitting: Emergency Medicine

## 2023-02-05 ENCOUNTER — Encounter (HOSPITAL_COMMUNITY): Payer: Self-pay | Admitting: Emergency Medicine

## 2023-02-05 ENCOUNTER — Other Ambulatory Visit: Payer: Self-pay

## 2023-02-05 DIAGNOSIS — R0602 Shortness of breath: Secondary | ICD-10-CM | POA: Diagnosis not present

## 2023-02-05 DIAGNOSIS — J441 Chronic obstructive pulmonary disease with (acute) exacerbation: Secondary | ICD-10-CM

## 2023-02-05 MED ORDER — DEXAMETHASONE SODIUM PHOSPHATE 10 MG/ML IJ SOLN
10.0000 mg | Freq: Once | INTRAMUSCULAR | Status: AC
Start: 2023-02-05 — End: 2023-02-05
  Administered 2023-02-05: 10 mg via INTRAMUSCULAR

## 2023-02-05 MED ORDER — PREDNISONE 20 MG PO TABS
40.0000 mg | ORAL_TABLET | Freq: Every day | ORAL | 0 refills | Status: AC
  Filled 2023-02-05: qty 10, 5d supply, fill #0

## 2023-02-05 MED ORDER — ALBUTEROL SULFATE HFA 108 (90 BASE) MCG/ACT IN AERS
1.0000 | INHALATION_SPRAY | Freq: Four times a day (QID) | RESPIRATORY_TRACT | 0 refills | Status: AC | PRN
  Filled 2023-02-05: qty 18, 25d supply, fill #0

## 2023-02-05 MED ORDER — FLUTICASONE-SALMETEROL 45-21 MCG/ACT IN AERO
2.0000 | INHALATION_SPRAY | Freq: Two times a day (BID) | RESPIRATORY_TRACT | 12 refills | Status: AC
  Filled 2023-02-05 – 2023-10-06 (×2): qty 12, 30d supply, fill #0
  Filled 2024-01-12 (×2): qty 12, 30d supply, fill #1

## 2023-02-05 MED ORDER — DEXAMETHASONE SODIUM PHOSPHATE 10 MG/ML IJ SOLN
INTRAMUSCULAR | Status: AC
  Filled 2023-02-05: qty 1

## 2023-02-05 NOTE — ED Triage Notes (Signed)
Worsening SOB starting Sunday. Hx of chronic COPD. Denies any worsening or change in color/consistency of normal secretions, denies fever, chills. Also reports back pain/rib cage pain, left upper side, from increased amount of coughing. States he feels like this is a COPD flare up and what usually helps is a steroid shot and inhaler refill.

## 2023-02-05 NOTE — Discharge Instructions (Signed)
You are having a COPD exacerbation, use the albuterol inhaler as needed.  Use the Advair inhaler daily.  Start the steroids tomorrow with breakfast.  Do not hesitate to return to clinic if despite these interventions you are still feeling short of breath.  Seek immediate care at the nearest emergency department if you develop severe shortness of breath, loss of consciousness, or any acute changes.  Is important to follow-up with a primary care provider regarding management of your COPD. As Urgent Care providers, we only only can evaluate you for an episodic event; and this cannot be substituted for the continued care and monitoring by your primary care provider and/or specialist. It is not unusual that a medical condition can present itself in one way, then progress or change and lead to another impression of your medical condition. If you should have any new or worsening symptoms, please go to the closest emergency department and contact your primary physician as soon as possible for further evaluation and testing.

## 2023-02-05 NOTE — ED Provider Notes (Signed)
[facility]    CSN: [id number] Arrival date & time: 02/05/23  1150      History   Chief Complaint Chief Complaint  Patient presents with   Shortness of Breath   COPD    HPI Jesse Walters is a 52 y.o. male.   Patient presents to clinic complaining of wheezing shortness of breath that started Sunday.  He does have a history of COPD.  Reports he has not been to his primary care and has been out of his albuterol and Advair inhalers for approximately over a year.  He denies any changes in his sputum, denies purulence, denies fevers.  Does have some chest discomfort with increased coughing.  No current chest pain.  Has been taken over-the-counter cough and cold medication that has been helping his symptoms.  The history is provided by the patient and medical records.  Shortness of Breath Associated symptoms: cough and wheezing   Associated symptoms: no chest pain, no fever and no sore throat   COPD Associated symptoms include shortness of breath. Pertinent negatives include no chest pain.    Past Medical History:  Diagnosis Date   COPD (chronic obstructive pulmonary disease) (HCC)    COPD (chronic obstructive pulmonary disease) (HCC)    Hypertension    Seasonal allergic rhinitis     Patient Active Problem List   Diagnosis Date Noted   Colon cancer screening declined 09/29/2020   COPD (chronic obstructive pulmonary disease) (HCC) 07/30/2020   COPD exacerbation (HCC) 07/29/2020   Acute respiratory failure with hypoxia (HCC) 07/29/2020   Tachycardia 07/29/2020   Mixed hyperlipidemia 06/27/2020   Influenza vaccine refused 06/26/2020   COVID-19 vaccination declined 06/26/2020   Essential hypertension 11/26/2019   Tobacco dependence 07/09/2019   Tobacco abuse 07/02/2019   Chronic obstructive pulmonary disease (HCC) 07/02/2019    Past Surgical History:  Procedure Laterality Date   HERNIA REPAIR         Home Medications    Prior to Admission medications    Medication Sig Start Date End Date Taking? Authorizing Provider  albuterol (VENTOLIN HFA) 108 (90 Base) MCG/ACT inhaler Inhale 1-2 puffs into the lungs every 6 (six) hours as needed for wheezing or shortness of breath. 02/05/23  Yes Rinaldo Ratel, [place] N, FNP  fluticasone-salmeterol (ADVAIR [facility]) [phone] MCG/ACT inhaler Inhale 2 puffs into the lungs 2 (two) times daily. 02/05/23  Yes Rinaldo Ratel, [place] N, FNP  predniSONE (DELTASONE) 20 MG tablet Take 2 tablets (40 mg total) by mouth daily with breakfast for 5 days. 02/05/23 02/10/23 Yes Rinaldo Ratel, [place] N, FNP  ipratropium-albuterol (DUONEB) 0.5-2.5 (3) MG/3ML SOLN TAKE 3 MLS BY NEBULIZATION EVERY 6 (SIX) HOURS AS NEEDED (SHORTNESS OF BREATH OR WHEEZING.). 09/29/20 09/29/21  Marcine Matar, MD    Family History Family History  Problem Relation Age of Onset   Cancer Mother     Social History Social History   Tobacco Use   Smoking status: Every Day    Current packs/day: 0.50    Types: Cigarettes   Smokeless tobacco: Never   Tobacco comments:    trying to quit  Vaping Use   Vaping status: Never Used  Substance Use Topics   Alcohol use: Yes    Alcohol/week: 7.0 standard drinks of alcohol    Types: 7 Cans of beer per week   Drug use: Yes    Types: Marijuana     Allergies   Dilaudid [hydromorphone hcl]   Review of Systems Review of Systems  Constitutional:  Negative for  fatigue and fever.  HENT:  Negative for congestion and sore throat.   Respiratory:  Positive for cough, shortness of breath and wheezing.   Cardiovascular:  Negative for chest pain.     Physical Exam Triage Vital Signs ED Triage Vitals  Encounter Vitals Group     BP 02/05/23 1229 (!) 166/98     Systolic BP Percentile --      Diastolic BP Percentile --      Pulse Rate 02/05/23 1229 96     Resp 02/05/23 1229 16     Temp 02/05/23 1229 98.5 F (36.9 C)     Temp Source 02/05/23 1229 Oral     SpO2 02/05/23 1229 96 %     Weight --      Height --      Head  Circumference --      Peak Flow --      Pain Score 02/05/23 1230 6     Pain Loc --      Pain Education --      Exclude from Growth Chart --    No data found.  Updated Vital Signs BP (!) 166/98 (BP Location: Left Arm)   Pulse 96   Temp 98.5 F (36.9 C) (Oral)   Resp 16   SpO2 96%   Visual Acuity Right Eye Distance:   Left Eye Distance:   Bilateral Distance:    Right Eye Near:   Left Eye Near:    Bilateral Near:     Physical Exam Vitals and nursing note reviewed.  Constitutional:      Appearance: Normal appearance. He is well-developed.  HENT:     Head: Normocephalic and atraumatic.     Right Ear: External ear normal.     Left Ear: External ear normal.     Nose: Nose normal.     Mouth/Throat:     Mouth: Mucous membranes are moist.  Cardiovascular:     Rate and Rhythm: Normal rate and regular rhythm.     Heart sounds: Normal heart sounds. No murmur heard. Pulmonary:     Effort: Pulmonary effort is normal.     Breath sounds: Wheezing present.  Musculoskeletal:        General: Normal range of motion.  Skin:    General: Skin is warm and dry.  Neurological:     General: No focal deficit present.     Mental Status: He is alert and oriented to person, place, and time.  Psychiatric:        Mood and Affect: Mood normal.        Behavior: Behavior normal. Behavior is cooperative.      UC Treatments / Results  Labs (all labs ordered are listed, but only abnormal results are displayed) Labs Reviewed - No data to display  EKG   Radiology No results found.  Procedures Procedures (including critical care time)  Medications Ordered in UC Medications  dexamethasone (DECADRON) injection 10 mg (has no administration in time range)    Initial Impression / Assessment and Plan / UC Course  I have reviewed the triage vital signs and the nursing notes.  Pertinent labs & imaging results that were available during my care of the patient were reviewed by me and  considered in my medical decision making (see chart for details).  Vitals and triage reviewed, patient is hemodynamically stable.  Does have inspiratory and expiratory wheezing on physical exam, declined DuoNeb in clinic.  IM Decadron given as well as steroid burst.  Will refill inhalers.  Encouraged follow-up with PCP.  Appears stable for outpatient treatment, given strict emergency precautions if symptoms worsen.  Antibiotics deferred as patient is not having any changes to sputum or any purulence.  Afebrile.  Plan of care, follow-up care and return precautions given, no questions at this time.   Final Clinical Impressions(s) / UC Diagnoses   Final diagnoses:  COPD exacerbation (HCC)  Shortness of breath     Discharge Instructions      You are having a COPD exacerbation, use the albuterol inhaler as needed.  Use the Advair inhaler daily.  Start the steroids tomorrow with breakfast.  Do not hesitate to return to clinic if despite these interventions you are still feeling short of breath.  Seek immediate care at the nearest emergency department if you develop severe shortness of breath, loss of consciousness, or any acute changes.  Is important to follow-up with a primary care provider regarding management of your COPD. As Urgent Care providers, we only only can evaluate you for an episodic event; and this cannot be substituted for the continued care and monitoring by your primary care provider and/or specialist. It is not unusual that a medical condition can present itself in one way, then progress or change and lead to another impression of your medical condition. If you should have any new or worsening symptoms, please go to the closest emergency department and contact your primary physician as soon as possible for further evaluation and testing.       ED Prescriptions     Medication Sig Dispense Auth. Provider   albuterol (VENTOLIN HFA) 108 (90 Base) MCG/ACT inhaler Inhale 1-2 puffs  into the lungs every 6 (six) hours as needed for wheezing or shortness of breath. 18 g Cecille Mcclusky, [place] N, FNP   fluticasone-salmeterol (ADVAIR [facility]) [phone] MCG/ACT inhaler Inhale 2 puffs into the lungs 2 (two) times daily. 1 each Dahir Ayer, [place] N, FNP   predniSONE (DELTASONE) 20 MG tablet Take 2 tablets (40 mg total) by mouth daily with breakfast for 5 days. 10 tablet Shaquilla Kehres, [place] N, FNP      PDMP not reviewed this encounter.   Aviannah Castoro, [place] N, [place] 02/05/23 1256

## 2023-02-10 ENCOUNTER — Other Ambulatory Visit: Payer: Self-pay

## 2023-10-06 ENCOUNTER — Other Ambulatory Visit: Payer: Self-pay

## 2023-10-07 ENCOUNTER — Other Ambulatory Visit: Payer: Self-pay

## 2023-10-08 ENCOUNTER — Other Ambulatory Visit: Payer: Self-pay

## 2023-10-14 ENCOUNTER — Other Ambulatory Visit: Payer: Self-pay

## 2023-10-14 ENCOUNTER — Encounter (HOSPITAL_COMMUNITY): Payer: Self-pay

## 2023-10-14 ENCOUNTER — Ambulatory Visit (HOSPITAL_COMMUNITY)
Admission: EM | Admit: 2023-10-14 | Discharge: 2023-10-14 | Disposition: A | Discharge status: Home / Self Care | Attending: Family Medicine | Admitting: Family Medicine

## 2023-10-14 DIAGNOSIS — K0889 Other specified disorders of teeth and supporting structures: Secondary | ICD-10-CM

## 2023-10-14 DIAGNOSIS — H9201 Otalgia, right ear: Secondary | ICD-10-CM | POA: Diagnosis not present

## 2023-10-14 MED ORDER — AMOXICILLIN 875 MG PO TABS
875.0000 mg | ORAL_TABLET | Freq: Two times a day (BID) | ORAL | 0 refills | Status: AC
  Filled 2023-10-14: qty 14, 7d supply, fill #0

## 2023-10-14 NOTE — ED Provider Notes (Signed)
 [facility]   [id number] 10/14/23 Arrival Time: 2130  ASSESSMENT & PLAN:  1. Right ear pain   2. Pain, dental    Ques if ear pain coming from dental infection. Discussed. Begin: Meds ordered this encounter  Medications   amoxicillin  (AMOXIL ) 875 MG tablet    Sig: Take 1 tablet (875 mg total) by mouth 2 (two) times daily for 7 days.    Dispense:  14 tablet    Refill:  0   OTC ibuprofen as needed.   Follow-up Information     Lawrance Presume, MD.   Specialty: Internal Medicine Why: If worsening or failing to improve as anticipated. Contact information: [address] [place] [address] [phone]                 Reviewed expectations re: course of current medical issues. Questions answered. Outlined signs and symptoms indicating need for more acute intervention. Understanding verbalized. After Visit Summary given.   SUBJECTIVE: History from: Patient. Jesse Walters is a 53 y.o. male. Patient c/o right ear pain that radiates into the neck x 5 days. Gradual onset; does reports feeling upper R "tooth pain" over past several days. Normal PO intake. Patient reports that he has been taking Ibuprofen for pain. Denies: CP/SOB/n/v/diaphoresis. Normal PO intake without n/v/d.  OBJECTIVE:  Vitals:   10/14/23 0934  BP: (!) 128/98  Pulse: 87  Resp: 17  Temp: 98 F (36.7 C)  TempSrc: Oral  SpO2: 97%    General appearance: alert; no distress Eyes: PERRLA; EOMI; conjunctiva normal HENT: [place]; AT; without nasal congestion; TMs appear normal; fair dentition with soreness over R upper gum/molar area Neck: supple without LAD Lungs: speaks full sentences without difficulty; unlabored Extremities: no edema Skin: warm and dry Neurologic: normal gait Psychological: alert and cooperative; normal mood and affect   Allergies  Allergen Reactions   Dilaudid [Hydromorphone Hcl] Hives    Past Medical History:  Diagnosis Date   COPD (chronic  obstructive pulmonary disease) (HCC)    COPD (chronic obstructive pulmonary disease) (HCC)    Hypertension    Seasonal allergic rhinitis    Social History   Socioeconomic History   Marital status: Single    Spouse name: Not on file   Number of children: Not on file   Years of education: Not on file   Highest education level: Not on file  Occupational History   Not on file  Tobacco Use   Smoking status: Every Day    Current packs/day: 0.50    Types: Cigarettes   Smokeless tobacco: Never   Tobacco comments:    trying to quit  Vaping Use   Vaping status: Never Used  Substance and Sexual Activity   Alcohol use: Yes    Alcohol/week: 7.0 standard drinks of alcohol    Types: 7 Cans of beer per week   Drug use: Yes    Types: Marijuana   Sexual activity: Not on file  Other Topics Concern   Not on file  Social History Narrative   Not on file   Social Drivers of Health   Financial Resource Strain: Not on file  Food Insecurity: Not on file  Transportation Needs: Not on file  Physical Activity: Not on file  Stress: Not on file  Social Connections: Not on file  Intimate Partner Violence: Not on file   Family History  Problem Relation Age of Onset   Cancer Mother    Past Surgical History:  Procedure  Laterality Date   HERNIA REPAIR       Afton Albright, MD 10/14/23 [phone]

## 2023-10-14 NOTE — ED Triage Notes (Signed)
 Patient c/o right ear pain that radiates into the neck x 5 days.  Patient reports that he has been taking Ibuprofen for pain.

## 2024-01-12 ENCOUNTER — Other Ambulatory Visit (HOSPITAL_COMMUNITY): Payer: Self-pay

## 2024-01-12 ENCOUNTER — Other Ambulatory Visit: Payer: Self-pay
